# Patient Record
Sex: Male | Born: 1995 | Hispanic: Yes | Marital: Married | Smoking: Current every day smoker
Health system: Southern US, Community
[De-identification: ages and names within clinical notes are randomized; demographics above are authoritative.]

## PROBLEM LIST (undated history)

## (undated) DIAGNOSIS — E669 Obesity, unspecified: Secondary | ICD-10-CM

## (undated) DIAGNOSIS — S31119A Laceration without foreign body of abdominal wall, unspecified quadrant without penetration into peritoneal cavity, initial encounter: Secondary | ICD-10-CM

## (undated) HISTORY — DX: Obesity, unspecified: E66.9

## (undated) HISTORY — DX: Laceration without foreign body of abdominal wall, unspecified quadrant without penetration into peritoneal cavity, initial encounter: S31.119A

---
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
## 2014-04-16 ENCOUNTER — Encounter (HOSPITAL_COMMUNITY): Payer: Self-pay | Admitting: Emergency Medicine

## 2014-04-16 ENCOUNTER — Emergency Department (INDEPENDENT_AMBULATORY_CARE_PROVIDER_SITE_OTHER)
Admission: EM | Admit: 2014-04-16 | Discharge: 2014-04-16 | Disposition: A | Discharge status: Home / Self Care | Payer: Self-pay | Source: Home / Self Care | Attending: Family Medicine | Admitting: Family Medicine

## 2014-04-16 DIAGNOSIS — R3129 Other microscopic hematuria: Secondary | ICD-10-CM

## 2014-04-16 DIAGNOSIS — R312 Other microscopic hematuria: Secondary | ICD-10-CM

## 2014-04-16 LAB — POCT URINALYSIS DIP (DEVICE)
BILIRUBIN URINE: NEGATIVE
GLUCOSE, UA: NEGATIVE mg/dL
Ketones, ur: NEGATIVE mg/dL
LEUKOCYTES UA: NEGATIVE
NITRITE: NEGATIVE
Protein, ur: NEGATIVE mg/dL
Specific Gravity, Urine: >=1.03 (ref 1.005–1.030)
Urobilinogen, UA: 0.2 mg/dL (ref 0.0–1.0)
pH: 6 (ref 5.0–8.0)

## 2014-04-16 MED ORDER — DOXYCYCLINE HYCLATE 100 MG PO CAPS: 100.0000 mg | ORAL_CAPSULE | Freq: Two times a day (BID) | ORAL | Status: DC

## 2014-04-16 NOTE — ED Notes (Signed)
C/o urinary frequency/urgency onset Friday Sx also include abd pain Denies f/v/n/d, dysuria, penile d/c Alert, no signs of acute distress.

## 2014-04-16 NOTE — ED Provider Notes (Signed)
CSN: [id number]     Arrival date & time 04/16/14  1402 History   First MD Initiated Contact with Patient 04/16/14 1453     Chief Complaint  Patient presents with  . Urinary Frequency   (Consider location/radiation/quality/duration/timing/severity/associated sxs/prior Treatment) Patient is a 18 y.o. male presenting with frequency. The history is provided by the patient.  Urinary Frequency This is a new problem. The current episode started more than 2 days ago. The problem has not changed since onset.Associated symptoms include abdominal pain. Pertinent negatives include no headaches and no shortness of breath.    History reviewed. No pertinent past medical history. History reviewed. No pertinent past surgical history. No family history on file. History  Substance Use Topics  . Smoking status: Current Some Day Smoker  . Smokeless tobacco: Not on file  . Alcohol Use: No    Review of Systems  Constitutional: Negative.  Negative for fever.  Respiratory: Negative for shortness of breath.   Gastrointestinal: Positive for abdominal pain. Negative for nausea and vomiting.  Genitourinary: Positive for urgency and frequency. Negative for dysuria, hematuria, discharge, penile swelling, genital sores, penile pain and testicular pain.  Musculoskeletal: Negative.   Neurological: Negative for headaches.    Allergies  Review of patient's allergies indicates no known allergies.  Home Medications   Prior to Admission medications   Medication Sig Start Date End Date Taking? Authorizing Provider  cephALEXin (KEFLEX) 500 MG capsule Take 1 capsule (500 mg total) by mouth 2 (two) times daily. 06/30/12   Nicole Pisciotta, PA-C  doxycycline (VIBRAMYCIN) 100 MG capsule Take 1 capsule (100 mg total) by mouth 2 (two) times daily. 04/16/14   Linna HoffJames D Declan Adamson, MD  traMADol (ULTRAM) 50 MG tablet Take 1 tablet (50 mg total) by mouth every 6 (six) hours as needed for pain. 06/30/12   Nicole Pisciotta, PA-C   BP  147/86 mmHg  Pulse 69  Temp(Src) 98 F (36.7 C) (Oral)  Resp 16  SpO2 98% Physical Exam  Constitutional: He is oriented to person, place, and time. He appears well-developed and well-nourished. No distress.  Abdominal: Soft. Normal appearance and bowel sounds are normal. He exhibits no distension and no mass. There is tenderness in the suprapubic area. There is no rigidity, no rebound and no guarding.  Neurological: He is alert and oriented to person, place, and time.  Skin: Skin is warm and dry.  Nursing note and vitals reviewed.   ED Course  Procedures (including critical care time) Labs Review Labs Reviewed  POCT URINALYSIS DIP (DEVICE) - Abnormal; Notable for the following:    Hgb urine dipstick LARGE (*)    All other components within normal limits    Imaging Review No results found.   MDM   1. Microscopic hematuria       Linna HoffJames D Heith Haigler, MD 04/16/14 [phone]

## 2014-04-16 NOTE — Discharge Instructions (Signed)
Drink lots of water , see urologist next week for urine recheck. Take all of antibiotic.

## 2014-04-22 ENCOUNTER — Emergency Department (HOSPITAL_COMMUNITY): Payer: Self-pay

## 2014-04-22 ENCOUNTER — Emergency Department (HOSPITAL_COMMUNITY)
Admission: EM | Admit: 2014-04-22 | Discharge: 2014-04-22 | Disposition: A | Discharge status: Home / Self Care | Payer: Self-pay | Attending: Emergency Medicine | Admitting: Emergency Medicine

## 2014-04-22 ENCOUNTER — Encounter (HOSPITAL_COMMUNITY): Payer: Self-pay | Admitting: *Deleted

## 2014-04-22 DIAGNOSIS — Z720 Tobacco use: Secondary | ICD-10-CM | POA: Insufficient documentation

## 2014-04-22 DIAGNOSIS — R10814 Left lower quadrant abdominal tenderness: Secondary | ICD-10-CM

## 2014-04-22 DIAGNOSIS — N201 Calculus of ureter: Secondary | ICD-10-CM | POA: Insufficient documentation

## 2014-04-22 DIAGNOSIS — R1030 Lower abdominal pain, unspecified: Secondary | ICD-10-CM

## 2014-04-22 DIAGNOSIS — Z792 Long term (current) use of antibiotics: Secondary | ICD-10-CM | POA: Insufficient documentation

## 2014-04-22 LAB — CBC WITH DIFFERENTIAL/PLATELET
BASOS ABS: 0 10*3/uL (ref 0.0–0.1)
BASOS PCT: 0 % (ref 0–1)
EOS ABS: 0.4 10*3/uL (ref 0.0–0.7)
EOS PCT: 4 % (ref 0–5)
HCT: 46.1 % (ref 39.0–52.0)
HEMOGLOBIN: 16.4 g/dL (ref 13.0–17.0)
LYMPHS PCT: 45 % (ref 12–46)
Lymphs Abs: 4.2 10*3/uL — ABNORMAL HIGH (ref 0.7–4.0)
MCH: 30.1 pg (ref 26.0–34.0)
MCHC: 35.6 g/dL (ref 30.0–36.0)
MCV: 84.6 fL (ref 78.0–100.0)
MONO ABS: 0.7 10*3/uL (ref 0.1–1.0)
Monocytes Relative: 8 % (ref 3–12)
Neutro Abs: 4 10*3/uL (ref 1.7–7.7)
Neutrophils Relative %: 43 % (ref 43–77)
PLATELETS: 300 10*3/uL (ref 150–400)
RBC: 5.45 MIL/uL (ref 4.22–5.81)
RDW: 12.8 % (ref 11.5–15.5)
WBC: 9.3 10*3/uL (ref 4.0–10.5)

## 2014-04-22 LAB — URINALYSIS, ROUTINE W REFLEX MICROSCOPIC
Bilirubin Urine: NEGATIVE
Glucose, UA: NEGATIVE mg/dL
KETONES UR: NEGATIVE mg/dL
LEUKOCYTES UA: NEGATIVE
NITRITE: NEGATIVE
PH: 5 (ref 5.0–8.0)
PROTEIN: NEGATIVE mg/dL
Specific Gravity, Urine: 1.017 (ref 1.005–1.030)
Urobilinogen, UA: 0.2 mg/dL (ref 0.0–1.0)

## 2014-04-22 LAB — URINE MICROSCOPIC-ADD ON

## 2014-04-22 LAB — COMPREHENSIVE METABOLIC PANEL
ALT: 153 U/L — ABNORMAL HIGH (ref 0–53)
AST: 62 U/L — AB (ref 0–37)
Albumin: 4.5 g/dL (ref 3.5–5.2)
Alkaline Phosphatase: 99 U/L (ref 39–117)
Anion gap: 14 (ref 5–15)
BUN: 13 mg/dL (ref 6–23)
CALCIUM: 9.5 mg/dL (ref 8.4–10.5)
CO2: 24 meq/L (ref 19–32)
CREATININE: 0.76 mg/dL (ref 0.50–1.35)
Chloride: 101 mEq/L (ref 96–112)
GFR calc Af Amer: >90 mL/min (ref >90)
Glucose, Bld: 100 mg/dL — ABNORMAL HIGH (ref 70–99)
Potassium: 4 mEq/L (ref 3.7–5.3)
Sodium: 139 mEq/L (ref 137–147)
TOTAL PROTEIN: 8 g/dL (ref 6.0–8.3)
Total Bilirubin: 0.3 mg/dL (ref 0.3–1.2)

## 2014-04-22 LAB — LIPASE, BLOOD: LIPASE: 42 U/L (ref 11–59)

## 2014-04-22 MED ORDER — MORPHINE SULFATE 4 MG/ML IJ SOLN
4.0000 mg | Freq: Once | INTRAMUSCULAR | Status: AC
  Administered 2014-04-22: 4 mg via INTRAVENOUS
  Filled 2014-04-22: qty 1

## 2014-04-22 MED ORDER — SODIUM CHLORIDE 0.9 % IV BOLUS (SEPSIS)
1000.0000 mL | Freq: Once | INTRAVENOUS | Status: AC
Start: 2014-04-22 — End: 2014-04-22
  Administered 2014-04-22: 1000 mL via INTRAVENOUS

## 2014-04-22 MED ORDER — IOHEXOL 300 MG/ML  SOLN
25.0000 mL | Freq: Once | INTRAMUSCULAR | Status: AC | PRN
  Administered 2014-04-22: 25 mL via ORAL

## 2014-04-22 MED ORDER — HYDROCODONE-ACETAMINOPHEN 5-325 MG PO TABS: 1.0000 | ORAL_TABLET | ORAL | Status: DC | PRN

## 2014-04-22 MED ORDER — IOHEXOL 300 MG/ML  SOLN
80.0000 mL | Freq: Once | INTRAMUSCULAR | Status: AC | PRN
  Administered 2014-04-22: 80 mL via INTRAVENOUS

## 2014-04-22 MED ORDER — TAMSULOSIN HCL 0.4 MG PO CAPS
0.4000 mg | ORAL_CAPSULE | Freq: Every day | ORAL | Status: DC
Start: 2014-04-22 — End: 2017-04-07

## 2014-04-22 NOTE — ED Provider Notes (Signed)
CSN: [id number]     Arrival date & time 04/22/14  0725 History   First MD Initiated Contact with Patient 04/22/14 0759     Chief Complaint  Patient presents with  . Abdominal Pain     (Consider location/radiation/quality/duration/timing/severity/associated sxs/prior Treatment) HPI  Kevin Fox is a 18 y.o. male without significant PMH presenting with 2-3 week history of lower abdominal pain worse in LLQ that described as a burning and intermittent. No nausea, vomiting, diarrhea. No hematochezia or melena. Pt presented to urgent care 5 days ago, diagnosed with hematuria and given doxycycline. Pt endorses improvement of pain for 2 days but then it returned. Patient does note that he has urgency. He also is frequency. No dysuria or smell to his urine. Patient denies any penile or scrotal complaints. No discharge or lesions. No frank hematuria. She denies fevers, chills, recent illnesses. No chest pain, shortness of breath, headaches. Patient does endorse mild back pain but denies currently.   History reviewed. No pertinent past medical history. History reviewed. No pertinent past surgical history. No family history on file. History  Substance Use Topics  . Smoking status: Current Some Day Smoker  . Smokeless tobacco: Not on file  . Alcohol Use: No    Review of Systems  Constitutional: Negative for fever and chills.  HENT: Negative for congestion and rhinorrhea.   Respiratory: Negative for shortness of breath.   Cardiovascular: Negative for chest pain.  Gastrointestinal: Positive for abdominal pain. Negative for nausea, vomiting and diarrhea.  Genitourinary: Positive for urgency and hematuria. Negative for dysuria, discharge, penile swelling, scrotal swelling and penile pain.  Musculoskeletal: Negative for back pain and gait problem.  Skin: Negative for rash.  Neurological: Negative for headaches.      Allergies  Review of patient's allergies indicates no known  allergies.  Home Medications   Prior to Admission medications   Medication Sig Start Date End Date Taking? Authorizing Provider  doxycycline (VIBRAMYCIN) 100 MG capsule Take 1 capsule (100 mg total) by mouth 2 (two) times daily. 04/16/14  Yes Linna HoffJames D Kindl, MD  cephALEXin (KEFLEX) 500 MG capsule Take 1 capsule (500 mg total) by mouth 2 (two) times daily. Patient not taking: Reported on 04/22/2014 06/30/12   Joni ReiningNicole Pisciotta, PA-C  HYDROcodone-acetaminophen (NORCO/VICODIN) 5-325 MG per tablet Take 1-2 tablets by mouth every 4 (four) hours as needed for moderate pain or severe pain. 04/22/14   Louann SjogrenVictoria L Roshawnda Pecora, PA-C  tamsulosin (FLOMAX) 0.4 MG CAPS capsule Take 1 capsule (0.4 mg total) by mouth daily after supper. 04/22/14   Louann SjogrenVictoria L Terence Bart, PA-C  traMADol (ULTRAM) 50 MG tablet Take 1 tablet (50 mg total) by mouth every 6 (six) hours as needed for pain. Patient not taking: Reported on 04/22/2014 06/30/12   Joni ReiningNicole Pisciotta, PA-C   BP 126/66 mmHg  Pulse 66  Temp(Src) 97.6 F (36.4 C) (Oral)  Resp 18  Ht 5\' 6"  (1.676 m)  SpO2 100% Physical Exam  Constitutional: He appears well-developed and well-nourished. No distress.  HENT:  Head: Normocephalic and atraumatic.  Eyes: Conjunctivae and EOM are normal. Right eye exhibits no discharge. Left eye exhibits no discharge.  Cardiovascular: Normal rate, regular rhythm and normal heart sounds.   Pulmonary/Chest: Effort normal and breath sounds normal. No respiratory distress. He has no wheezes.  Abdominal: Soft. Bowel sounds are normal. He exhibits no distension.  Diffuse abdominal tenderness worse and left lower quadrant and suprapubic region without rebound, rigidity, or guarding. No back tenderness or CVA tenderness.  Genitourinary:  Normal appearing uncircumcised penis without erythema, lesions, swelling. No penile discharge. Scrotum without edema, erythema or lesions. Normal testicular lie. No tenderness to palpation. No inguinal hernias.    Neurological: He is alert. He exhibits normal muscle tone. Coordination normal.  Skin: Skin is warm and dry. He is not diaphoretic.  Nursing note and vitals reviewed.   ED Course  Procedures (including critical care time) Labs Review Labs Reviewed  CBC WITH DIFFERENTIAL - Abnormal; Notable for the following:    Lymphs Abs 4.2 (*)    All other components within normal limits  COMPREHENSIVE METABOLIC PANEL - Abnormal; Notable for the following:    Glucose, Bld 100 (*)    AST 62 (*)    ALT 153 (*)    All other components within normal limits  URINALYSIS, ROUTINE W REFLEX MICROSCOPIC - Abnormal; Notable for the following:    APPearance HAZY (*)    Hgb urine dipstick LARGE (*)    All other components within normal limits  URINE MICROSCOPIC-ADD ON - Abnormal; Notable for the following:    Squamous Epithelial / LPF FEW (*)    Bacteria, UA FEW (*)    Crystals CA OXALATE CRYSTALS (*)    All other components within normal limits  LIPASE, BLOOD    Imaging Review Ct Abdomen Pelvis W Contrast  04/22/2014   CLINICAL DATA:  Two week history of abdominal pain  EXAM: CT ABDOMEN AND PELVIS WITH CONTRAST  TECHNIQUE: Multidetector CT imaging of the abdomen and pelvis was performed using the standard protocol following bolus administration of intravenous contrast. Oral contrast was also administered.  CONTRAST:  80mL OMNIPAQUE IOHEXOL 300 MG/ML  SOLN  COMPARISON:  None.  FINDINGS: There is mild bibasilar lung atelectatic change.  No focal liver lesions are identified. Gallbladder wall is not thickened. There is no appreciable biliary duct dilatation.  Spleen, pancreas, and adrenals appear normal. Kidneys bilaterally show no mass or hydronephrosis on either side. There is no renal calculus on either side. There is a 1 mm calculus in the right ureterovesical junction seen on axial slice 91 series 2. No other ureteral calculi are identified.  In the pelvis, the urinary bladder is midline with normal wall  thickness. There is no pelvic mass or fluid collection.  The appendix appears normal. The terminal ileum appears normal. There are several lymph nodes in the right lower quadrant, largest measuring 1.2 x 0.9 cm.  There is no bowel obstruction. No free air or portal venous air. There is no appreciable ascites, abscess, or adenopathy by size criteria. Aorta shows no evidence of aneurysm. There are no blastic or lytic bone lesions.  IMPRESSION: 1 mm calculus right ureterovesical junction without appreciable hydronephrosis or ureterectasis on the right.  Appendix appears normal. There are several nearby lymph nodes focally in the right lower quadrant. This finding potentially may indicate a degree of mesenteric adenitis.  No bowel obstruction.  No abscess.   Electronically Signed   By: Bretta Bang M.D.   On: 04/22/2014 10:38     EKG Interpretation None      MDM   Final diagnoses:  LLQ abdominal tenderness  Ureterolithiasis  Suprapubic abdominal pain, unspecified laterality   Pt has been diagnosed with a Kidney Stone via CT. There is no evidence of significant hydronephrosis, serum creatine WNL, vitals sign stable and the pt does not have irratractable vomiting. UA without evidence of infection. On repeat abdominal exam pt with improvement of abdominal tenderness. Pt has nonsurgical abdomen.  Pt likely due to ureterolithiasis. Pt will be dc home with pain medications and flomax. Driving and sedation precautions provided. Patient without a PCP. Patient to establish care and follow up with the wellness center.   Discussed return precautions with patient. Discussed all results and patient verbalizes understanding and agrees with plan.  Case has been discussed with Dr. Gwendolyn GrantWalden who agrees with the above plan and to discharge.       Louann SjogrenVictoria L Trei Schoch, PA-C 04/22/14 1252  Elwin MochaBlair Walden, MD 04/22/14 1539

## 2014-04-22 NOTE — ED Notes (Signed)
Pt states that he has had lower abdominal pain that has been ongoing for 2-3 weeks. Pt states that he was seen at urgent care for the same and given antibiotics. Pt states that area is tender to touch. Denies N/V/D

## 2014-04-22 NOTE — Discharge Instructions (Signed)
Return to the emergency room with worsening of symptoms, new symptoms or with symptoms that are concerning, especially severe pain,  unable to tolerate fluids, or abdominal pain, fevers, unable to urinate. Make appointment with wellness center to establish care. norco for severe pain. Do not operate machinery, drive or drink alcohol while taking narcotics or muscle relaxers. Stop taking doxycycline. Start taking ibuprofen. Ibuprofen 400mg  (2 tablets 200mg ) every 5-6 hours for 3-5 days and then as needed for pain.   Kidney Stones Kidney stones (urolithiasis) are deposits that form inside your kidneys. The intense pain is caused by the stone moving through the urinary tract. When the stone moves, the ureter goes into spasm around the stone. The stone is usually passed in the urine.  CAUSES   A disorder that makes certain neck glands produce too much parathyroid hormone (primary hyperparathyroidism).  A buildup of uric acid crystals, similar to gout in your joints.  Narrowing (stricture) of the ureter.  A kidney obstruction present at birth (congenital obstruction).  Previous surgery on the kidney or ureters.  Numerous kidney infections. SYMPTOMS   Feeling sick to your stomach (nauseous).  Throwing up (vomiting).  Blood in the urine (hematuria).  Pain that usually spreads (radiates) to the groin.  Frequency or urgency of urination. DIAGNOSIS   Taking a history and physical exam.  Blood or urine tests.  CT scan.  Occasionally, an examination of the inside of the urinary bladder (cystoscopy) is performed. TREATMENT   Observation.  Increasing your fluid intake.  Extracorporeal shock wave lithotripsy--This is a noninvasive procedure that uses shock waves to break up kidney stones.  Surgery may be needed if you have severe pain or persistent obstruction. There are various surgical procedures. Most of the procedures are performed with the use of small instruments. Only small  incisions are needed to accommodate these instruments, so recovery time is minimized. The size, location, and chemical composition are all important variables that will determine the proper choice of action for you. Talk to your health care provider to better understand your situation so that you will minimize the risk of injury to yourself and your kidney.  HOME CARE INSTRUCTIONS   Drink enough water and fluids to keep your urine clear or pale yellow. This will help you to pass the stone or stone fragments.  Strain all urine through the provided strainer. Keep all particulate matter and stones for your health care provider to see. The stone causing the pain may be as small as a grain of salt. It is very important to use the strainer each and every time you pass your urine. The collection of your stone will allow your health care provider to analyze it and verify that a stone has actually passed. The stone analysis will often identify what you can do to reduce the incidence of recurrences.  Only take over-the-counter or prescription medicines for pain, discomfort, or fever as directed by your health care provider.  Make a follow-up appointment with your health care provider as directed.  Get follow-up X-rays if required. The absence of pain does not always mean that the stone has passed. It may have only stopped moving. If the urine remains completely obstructed, it can cause loss of kidney function or even complete destruction of the kidney. It is your responsibility to make sure X-rays and follow-ups are completed. Ultrasounds of the kidney can show blockages and the status of the kidney. Ultrasounds are not associated with any radiation and can be performed easily  in a matter of minutes. SEEK MEDICAL CARE IF:  You experience pain that is progressive and unresponsive to any pain medicine you have been prescribed. SEEK IMMEDIATE MEDICAL CARE IF:   Pain cannot be controlled with the prescribed  medicine.  You have a fever or shaking chills.  The severity or intensity of pain increases over 18 hours and is not relieved by pain medicine.  You develop a new onset of abdominal pain.  You feel faint or pass out.  You are unable to urinate. MAKE SURE YOU:   Understand these instructions.  Will watch your condition.  Will get help right away if you are not doing well or get worse. Document Released: 05/09/2005 Document Revised: 01/09/2013 Document Reviewed: 10/10/2012 [facility] Patient Information 2015 [employer] [place]. This information is not intended to replace advice given to you by your health care provider. Make sure you discuss any questions you have with your health care provider.

## 2014-05-19 ENCOUNTER — Ambulatory Visit: Payer: Self-pay

## 2015-06-10 ENCOUNTER — Emergency Department (INDEPENDENT_AMBULATORY_CARE_PROVIDER_SITE_OTHER)
Admission: EM | Admit: 2015-06-10 | Discharge: 2015-06-10 | Disposition: A | Discharge status: Home / Self Care | Payer: Self-pay | Source: Home / Self Care | Attending: Family Medicine | Admitting: Family Medicine

## 2015-06-10 ENCOUNTER — Encounter (HOSPITAL_COMMUNITY): Payer: Self-pay | Admitting: Emergency Medicine

## 2015-06-10 ENCOUNTER — Emergency Department (INDEPENDENT_AMBULATORY_CARE_PROVIDER_SITE_OTHER): Payer: Self-pay

## 2015-06-10 DIAGNOSIS — S93402A Sprain of unspecified ligament of left ankle, initial encounter: Secondary | ICD-10-CM

## 2015-06-10 MED ORDER — HYDROCODONE-ACETAMINOPHEN 5-325 MG PO TABS: 1.0000 | ORAL_TABLET | ORAL | Status: DC | PRN

## 2015-06-10 NOTE — ED Notes (Signed)
Reports walking outside today.  Walking down steps and rolled left ankle.  Feels pain and heard a pop at the time of the incident.

## 2015-06-10 NOTE — ED Provider Notes (Signed)
CSN: [id number]     Arrival date & time 06/10/15  1925 History   First MD Initiated Contact with Patient 06/10/15 2008     Chief Complaint  Patient presents with  . Ankle Pain   (Consider location/radiation/quality/duration/timing/severity/associated sxs/prior Treatment) Patient is a 20 y.o. male presenting with ankle pain. The history is provided by the patient. No language interpreter was used.  Ankle Pain Location:  Ankle and foot Injury: yes   Ankle location:  L ankle Foot location:  L foot Pain details:    Quality:  Aching   Radiates to:  Does not radiate   Severity:  No pain   Onset quality:  Gradual   Timing:  Constant Chronicity:  New Prior injury to area:  No Relieved by:  Nothing Worsened by:  Nothing tried Ineffective treatments:  None tried Risk factors: no known bone disorder     History reviewed. No pertinent past medical history. History reviewed. No pertinent past surgical history. No family history on file. Social History  Substance Use Topics  . Smoking status: Never Smoker   . Smokeless tobacco: None  . Alcohol Use: Yes    Review of Systems  All other systems reviewed and are negative.   Allergies  Review of patient's allergies indicates no known allergies.  Home Medications   Prior to Admission medications   Medication Sig Start Date End Date Taking? Authorizing Provider  cephALEXin (KEFLEX) 500 MG capsule Take 1 capsule (500 mg total) by mouth 2 (two) times daily. Patient not taking: Reported on 04/22/2014 06/30/12   Joni Reining Pisciotta, PA-C  doxycycline (VIBRAMYCIN) 100 MG capsule Take 1 capsule (100 mg total) by mouth 2 (two) times daily. Patient not taking: Reported on 06/10/2015 04/16/14   Linna Hoff, MD  HYDROcodone-acetaminophen (NORCO/VICODIN) 5-325 MG per tablet Take 1-2 tablets by mouth every 4 (four) hours as needed for moderate pain or severe pain. Patient not taking: Reported on 06/10/2015 04/22/14   Oswaldo Conroy, PA-C  tamsulosin  (FLOMAX) 0.4 MG CAPS capsule Take 1 capsule (0.4 mg total) by mouth daily after supper. Patient not taking: Reported on 06/10/2015 04/22/14   Oswaldo Conroy, PA-C  traMADol (ULTRAM) 50 MG tablet Take 1 tablet (50 mg total) by mouth every 6 (six) hours as needed for pain. Patient not taking: Reported on 04/22/2014 06/30/12   Wynetta Emery, PA-C   Meds Ordered and Administered this Visit  Medications - No data to display  BP 145/91 mmHg  Pulse 97  Temp(Src) 98.1 F (36.7 C)  SpO2 99% No data found.   Physical Exam  Constitutional: He is oriented to person, place, and time. He appears well-developed and well-nourished.  Musculoskeletal: He exhibits tenderness.  Swollen tender left ankle,  Pain with range of motion,  nv and ns intact  Neurological: He is alert and oriented to person, place, and time. He has normal reflexes.  Skin: Skin is warm.  Psychiatric: He has a normal mood and affect.  Nursing note and vitals reviewed.   ED Course  Procedures (including critical care time)  Labs Review Labs Reviewed - No data to display  Imaging Review No results found.   Visual Acuity Review  Right Eye Distance:   Left Eye Distance:   Bilateral Distance:    Right Eye Near:   Left Eye Near:    Bilateral Near:         MDM   1. Ankle sprain, left, initial encounter    aso Crutches Schedule to see Dr.  swintech for recheck in 1 week Meds ordered this encounter  Medications  . HYDROcodone-acetaminophen (NORCO/VICODIN) 5-325 MG tablet    Sig: Take 1-2 tablets by mouth every 4 (four) hours as needed for moderate pain or severe pain.    Dispense:  20 tablet    Refill:  0    Order Specific Question:  Supervising Provider    Answer:  Bradd Canary D [[id number]]     Lonia Skinner [place], PA-C 06/10/15 2046

## 2015-06-10 NOTE — Discharge Instructions (Signed)
Ankle Sprain  An ankle sprain is an injury to the strong, fibrous tissues (ligaments) that hold the bones of your ankle joint together.   CAUSES  An ankle sprain is usually caused by a fall or by twisting your ankle. Ankle sprains most commonly occur when you step on the outer edge of your foot, and your ankle turns inward. People who participate in sports are more prone to these types of injuries.   SYMPTOMS    Pain in your ankle. The pain may be present at rest or only when you are trying to stand or walk.   Swelling.   Bruising. Bruising may develop immediately or within 1 to 2 days after your injury.   Difficulty standing or walking, particularly when turning corners or changing directions.  DIAGNOSIS   Your caregiver will ask you details about your injury and perform a physical exam of your ankle to determine if you have an ankle sprain. During the physical exam, your caregiver will press on and apply pressure to specific areas of your foot and ankle. Your caregiver will try to move your ankle in certain ways. An X-ray exam may be done to be sure a bone was not broken or a ligament did not separate from one of the bones in your ankle (avulsion fracture).   TREATMENT   Certain types of braces can help stabilize your ankle. Your caregiver can make a recommendation for this. Your caregiver may recommend the use of medicine for pain. If your sprain is severe, your caregiver may refer you to a surgeon who helps to restore function to parts of your skeletal system (orthopedist) or a physical therapist.  HOME CARE INSTRUCTIONS    Apply ice to your injury for 1-2 days or as directed by your caregiver. Applying ice helps to reduce inflammation and pain.    Put ice in a plastic bag.    Place a towel between your skin and the bag.    Leave the ice on for 15-20 minutes at a time, every 2 hours while you are awake.   Only take over-the-counter or prescription medicines for pain, discomfort, or fever as directed by  your caregiver.   Elevate your injured ankle above the level of your heart as much as possible for 2-3 days.   If your caregiver recommends crutches, use them as instructed. Gradually put weight on the affected ankle. Continue to use crutches or a cane until you can walk without feeling pain in your ankle.   If you have a plaster splint, wear the splint as directed by your caregiver. Do not rest it on anything harder than a pillow for the first 24 hours. Do not put weight on it. Do not get it wet. You may take it off to take a shower or bath.   You may have been given an elastic bandage to wear around your ankle to provide support. If the elastic bandage is too tight (you have numbness or tingling in your foot or your foot becomes cold and blue), adjust the bandage to make it comfortable.   If you have an air splint, you may blow more air into it or let air out to make it more comfortable. You may take your splint off at night and before taking a shower or bath. Wiggle your toes in the splint several times per day to decrease swelling.  SEEK MEDICAL CARE IF:    You have rapidly increasing bruising or swelling.   Your toes feel   extremely cold or you lose feeling in your foot.   Your pain is not relieved with medicine.  SEEK IMMEDIATE MEDICAL CARE IF:   Your toes are numb or blue.   You have severe pain that is increasing.  MAKE SURE YOU:    Understand these instructions.   Will watch your condition.   Will get help right away if you are not doing well or get worse.     This information is not intended to replace advice given to you by your health care provider. Make sure you discuss any questions you have with your health care provider.     Document Released: 05/09/2005 Document Revised: 05/30/2014 Document Reviewed: 05/21/2011  Elsevier Interactive Patient Education 2016 [employer]

## 2017-01-31 ENCOUNTER — Emergency Department (HOSPITAL_COMMUNITY): Payer: Medicaid Other

## 2017-01-31 ENCOUNTER — Encounter (HOSPITAL_COMMUNITY): Admission: EM | Disposition: A | Discharge status: Home / Self Care | Payer: Self-pay | Source: Home / Self Care

## 2017-01-31 ENCOUNTER — Inpatient Hospital Stay (HOSPITAL_COMMUNITY)
Admission: EM | Admit: 2017-01-31 | Discharge: 2017-02-02 | DRG: 329 | Disposition: A | Discharge status: Home / Self Care | Payer: Medicaid Other | Attending: Surgery | Admitting: Surgery

## 2017-01-31 ENCOUNTER — Emergency Department (HOSPITAL_COMMUNITY): Payer: Medicaid Other | Admitting: Anesthesiology

## 2017-01-31 ENCOUNTER — Encounter (HOSPITAL_COMMUNITY): Payer: Self-pay | Admitting: Emergency Medicine

## 2017-01-31 DIAGNOSIS — S31109A Unspecified open wound of abdominal wall, unspecified quadrant without penetration into peritoneal cavity, initial encounter: Secondary | ICD-10-CM | POA: Diagnosis present

## 2017-01-31 DIAGNOSIS — S31119A Laceration without foreign body of abdominal wall, unspecified quadrant without penetration into peritoneal cavity, initial encounter: Secondary | ICD-10-CM

## 2017-01-31 DIAGNOSIS — S31615A Laceration without foreign body of abdominal wall, periumbilic region with penetration into peritoneal cavity, initial encounter: Secondary | ICD-10-CM | POA: Diagnosis present

## 2017-01-31 DIAGNOSIS — R402142 Coma scale, eyes open, spontaneous, at arrival to emergency department: Secondary | ICD-10-CM | POA: Diagnosis present

## 2017-01-31 DIAGNOSIS — R402252 Coma scale, best verbal response, oriented, at arrival to emergency department: Secondary | ICD-10-CM | POA: Diagnosis present

## 2017-01-31 DIAGNOSIS — F17210 Nicotine dependence, cigarettes, uncomplicated: Secondary | ICD-10-CM | POA: Diagnosis present

## 2017-01-31 DIAGNOSIS — S36438A Laceration of other part of small intestine, initial encounter: Principal | ICD-10-CM | POA: Diagnosis present

## 2017-01-31 DIAGNOSIS — R402362 Coma scale, best motor response, obeys commands, at arrival to emergency department: Secondary | ICD-10-CM | POA: Diagnosis present

## 2017-01-31 HISTORY — PX: LAPAROTOMY: SHX154

## 2017-01-31 LAB — I-STAT CHEM 8, ED
BUN: 21 mg/dL — AB (ref 6–20)
CALCIUM ION: 1.16 mmol/L (ref 1.15–1.40)
CHLORIDE: 105 mmol/L (ref 101–111)
CREATININE: 0.8 mg/dL (ref 0.61–1.24)
GLUCOSE: 143 mg/dL — AB (ref 65–99)
HCT: 47 % (ref 39.0–52.0)
Hemoglobin: 16 g/dL (ref 13.0–17.0)
Potassium: 3.5 mmol/L (ref 3.5–5.1)
Sodium: 143 mmol/L (ref 135–145)
TCO2: 23 mmol/L (ref 22–32)

## 2017-01-31 LAB — CBC
HCT: 46.4 % (ref 39.0–52.0)
HEMOGLOBIN: 16.4 g/dL (ref 13.0–17.0)
MCH: 30.7 pg (ref 26.0–34.0)
MCHC: 35.3 g/dL (ref 30.0–36.0)
MCV: 86.7 fL (ref 78.0–100.0)
Platelets: 276 10*3/uL (ref 150–400)
RBC: 5.35 MIL/uL (ref 4.22–5.81)
RDW: 12.7 % (ref 11.5–15.5)
WBC: 13.3 10*3/uL — ABNORMAL HIGH (ref 4.0–10.5)

## 2017-01-31 LAB — COMPREHENSIVE METABOLIC PANEL
ALT: 180 U/L — ABNORMAL HIGH (ref 17–63)
ANION GAP: 10 (ref 5–15)
AST: 82 U/L — ABNORMAL HIGH (ref 15–41)
Albumin: 4.4 g/dL (ref 3.5–5.0)
Alkaline Phosphatase: 88 U/L (ref 38–126)
BILIRUBIN TOTAL: 0.4 mg/dL (ref 0.3–1.2)
BUN: 18 mg/dL (ref 6–20)
CALCIUM: 9.7 mg/dL (ref 8.9–10.3)
CO2: 20 mmol/L — ABNORMAL LOW (ref 22–32)
Chloride: 107 mmol/L (ref 101–111)
Creatinine, Ser: 0.96 mg/dL (ref 0.61–1.24)
GFR calc non Af Amer: >60 mL/min (ref >60)
GLUCOSE: 139 mg/dL — AB (ref 65–99)
Potassium: 3.5 mmol/L (ref 3.5–5.1)
Sodium: 137 mmol/L (ref 135–145)
TOTAL PROTEIN: 7.5 g/dL (ref 6.5–8.1)

## 2017-01-31 LAB — ABO/RH: ABO/RH(D): O POS

## 2017-01-31 LAB — PROTIME-INR
INR: 0.99
Prothrombin Time: 13 seconds (ref 11.4–15.2)

## 2017-01-31 LAB — CDS SEROLOGY

## 2017-01-31 LAB — I-STAT CG4 LACTIC ACID, ED: LACTIC ACID, VENOUS: 2.65 mmol/L — AB (ref 0.5–1.9)

## 2017-01-31 LAB — ETHANOL

## 2017-01-31 SURGERY — LAPAROTOMY, EXPLORATORY
Anesthesia: General | Site: Abdomen

## 2017-01-31 MED ORDER — CEFAZOLIN SODIUM-DEXTROSE 2-3 GM-% IV SOLR
INTRAVENOUS | Status: DC | PRN
  Administered 2017-01-31: 2 g via INTRAVENOUS

## 2017-01-31 MED ORDER — MIDAZOLAM HCL 2 MG/2ML IJ SOLN
INTRAMUSCULAR | Status: AC
  Filled 2017-01-31: qty 2

## 2017-01-31 MED ORDER — SUCCINYLCHOLINE CHLORIDE 20 MG/ML IJ SOLN
INTRAMUSCULAR | Status: DC | PRN
  Administered 2017-01-31: 160 mg via INTRAVENOUS

## 2017-01-31 MED ORDER — HYDROMORPHONE HCL 1 MG/ML IJ SOLN
INTRAMUSCULAR | Status: AC
  Filled 2017-01-31: qty 1

## 2017-01-31 MED ORDER — MIDAZOLAM HCL 5 MG/5ML IJ SOLN
INTRAMUSCULAR | Status: DC | PRN
  Administered 2017-01-31: 2 mg via INTRAVENOUS

## 2017-01-31 MED ORDER — LIDOCAINE HCL (CARDIAC) 20 MG/ML IV SOLN
INTRAVENOUS | Status: DC | PRN
  Administered 2017-01-31: 60 mg via INTRAVENOUS

## 2017-01-31 MED ORDER — LACTATED RINGERS IV SOLN
INTRAVENOUS | Status: DC | PRN
  Administered 2017-01-31: via INTRAVENOUS

## 2017-01-31 MED ORDER — FENTANYL CITRATE (PF) 100 MCG/2ML IJ SOLN
INTRAMUSCULAR | Status: DC | PRN
  Administered 2017-01-31: 100 ug via INTRAVENOUS
  Administered 2017-02-01: 150 ug via INTRAVENOUS

## 2017-01-31 MED ORDER — FENTANYL CITRATE (PF) 250 MCG/5ML IJ SOLN
INTRAMUSCULAR | Status: AC
  Filled 2017-01-31: qty 5

## 2017-01-31 MED ORDER — ROCURONIUM BROMIDE 100 MG/10ML IV SOLN
INTRAVENOUS | Status: DC | PRN
  Administered 2017-01-31: 50 mg via INTRAVENOUS

## 2017-01-31 MED ORDER — IOPAMIDOL (ISOVUE-300) INJECTION 61%
INTRAVENOUS | Status: AC
Start: 2017-01-31 — End: 2017-01-31
  Administered 2017-01-31: 100 mL
  Filled 2017-01-31: qty 100

## 2017-01-31 MED ORDER — PROPOFOL 10 MG/ML IV BOLUS
INTRAVENOUS | Status: AC
  Filled 2017-01-31: qty 20

## 2017-01-31 MED ORDER — HYDROMORPHONE HCL 1 MG/ML IJ SOLN
INTRAMUSCULAR | Status: AC | PRN
  Administered 2017-01-31: 1 mg via INTRAVENOUS

## 2017-01-31 MED ORDER — SODIUM CHLORIDE 0.9 % IV SOLN
INTRAVENOUS | Status: AC | PRN
  Administered 2017-01-31: 1000 mL via INTRAVENOUS

## 2017-01-31 MED ORDER — FENTANYL CITRATE (PF) 100 MCG/2ML IJ SOLN
INTRAMUSCULAR | Status: AC | PRN
  Administered 2017-01-31: 100 ug via INTRAVENOUS

## 2017-01-31 MED ORDER — FENTANYL CITRATE (PF) 100 MCG/2ML IJ SOLN
INTRAMUSCULAR | Status: AC
  Filled 2017-01-31: qty 2

## 2017-01-31 MED ORDER — PROPOFOL 10 MG/ML IV BOLUS
INTRAVENOUS | Status: DC | PRN
  Administered 2017-01-31: 160 mg via INTRAVENOUS

## 2017-01-31 SURGICAL SUPPLY — 66 items
APL SKNCLS STERI-STRIP NONHPOA (GAUZE/BANDAGES/DRESSINGS) ×2
APPLIER CLIP ROT 10 11.4 M/L (STAPLE)
APR CLP MED LRG 11.4X10 (STAPLE)
BAG SPEC RTRVL LRG 6X4 10 (ENDOMECHANICALS)
BENZOIN TINCTURE PRP APPL 2/3 (GAUZE/BANDAGES/DRESSINGS) ×4 IMPLANT
BLADE CLIPPER SURG (BLADE) ×3 IMPLANT
CANISTER SUCT 3000ML PPV (MISCELLANEOUS) ×4 IMPLANT
CHLORAPREP W/TINT 26ML (MISCELLANEOUS) ×4 IMPLANT
CLIP APPLIE ROT 10 11.4 M/L (STAPLE) IMPLANT
CLOSURE WOUND 1/2 X4 (GAUZE/BANDAGES/DRESSINGS) ×1
COVER SURGICAL LIGHT HANDLE (MISCELLANEOUS) ×4 IMPLANT
CUTTER FLEX LINEAR 45M (STAPLE) ×4 IMPLANT
DRAPE LAPAROSCOPIC ABDOMINAL (DRAPES) ×4 IMPLANT
DRAPE WARM FLUID 44X44 (DRAPE) ×4 IMPLANT
DRSG OPSITE POSTOP 4X10 (GAUZE/BANDAGES/DRESSINGS) ×3 IMPLANT
DRSG OPSITE POSTOP 4X8 (GAUZE/BANDAGES/DRESSINGS) IMPLANT
DRSG TEGADERM 2-3/8X2-3/4 SM (GAUZE/BANDAGES/DRESSINGS) ×2 IMPLANT
DRSG TEGADERM 4X4.75 (GAUZE/BANDAGES/DRESSINGS) ×4 IMPLANT
ELECT BLADE 6.5 EXT (BLADE) ×3 IMPLANT
ELECT CAUTERY BLADE 6.4 (BLADE) ×5 IMPLANT
ELECT REM PT RETURN 9FT ADLT (ELECTROSURGICAL) ×4
ELECTRODE REM PT RTRN 9FT ADLT (ELECTROSURGICAL) ×2 IMPLANT
ENDOLOOP SUT PDS II  0 18 (SUTURE)
ENDOLOOP SUT PDS II 0 18 (SUTURE) IMPLANT
FILTER SMOKE EVAC LAPAROSHD (FILTER) ×1 IMPLANT
GAUZE SPONGE 2X2 8PLY STRL LF (GAUZE/BANDAGES/DRESSINGS) ×2 IMPLANT
GLOVE BIO SURGEON STRL SZ7 (GLOVE) ×4 IMPLANT
GLOVE BIOGEL PI IND STRL 7.5 (GLOVE) ×2 IMPLANT
GLOVE BIOGEL PI INDICATOR 7.5 (GLOVE) ×2
GOWN STRL REUS W/ TWL LRG LVL3 (GOWN DISPOSABLE) ×3 IMPLANT
GOWN STRL REUS W/TWL LRG LVL3 (GOWN DISPOSABLE)
KIT BASIN OR (CUSTOM PROCEDURE TRAY) ×4 IMPLANT
KIT ROOM TURNOVER OR (KITS) ×4 IMPLANT
LIGASURE IMPACT 36 18CM CVD LR (INSTRUMENTS) IMPLANT
NS IRRIG 1000ML POUR BTL (IV SOLUTION) ×8 IMPLANT
PACK GENERAL/GYN (CUSTOM PROCEDURE TRAY) ×4 IMPLANT
PAD ARMBOARD 7.5X6 YLW CONV (MISCELLANEOUS) ×8 IMPLANT
POUCH SPECIMEN RETRIEVAL 10MM (ENDOMECHANICALS) ×1 IMPLANT
RELOAD STAPLE 45 3.5 BLU ETS (ENDOMECHANICALS) ×1 IMPLANT
RELOAD STAPLE TA45 3.5 REG BLU (ENDOMECHANICALS) ×4 IMPLANT
SCISSORS ENDO CVD 5DCS (MISCELLANEOUS) IMPLANT
SET IRRIG TUBING LAPAROSCOPIC (IRRIGATION / IRRIGATOR) ×1 IMPLANT
SHEARS HARMONIC ACE PLUS 36CM (ENDOMECHANICALS) ×1 IMPLANT
SLEEVE ENDOPATH XCEL 5M (ENDOMECHANICALS) ×1 IMPLANT
SPECIMEN JAR LARGE (MISCELLANEOUS) IMPLANT
SPECIMEN JAR SMALL (MISCELLANEOUS) ×4 IMPLANT
SPONGE GAUZE 2X2 STER 10/PKG (GAUZE/BANDAGES/DRESSINGS) ×2
SPONGE LAP 18X18 X RAY DECT (DISPOSABLE) ×6 IMPLANT
STAPLER VISISTAT 35W (STAPLE) ×4 IMPLANT
STRIP CLOSURE SKIN 1/2X4 (GAUZE/BANDAGES/DRESSINGS) ×3 IMPLANT
SUCTION POOLE TIP (SUCTIONS) ×4 IMPLANT
SUT MNCRL AB 4-0 PS2 18 (SUTURE) ×4 IMPLANT
SUT PDS AB 1 TP1 96 (SUTURE) ×8 IMPLANT
SUT SILK 2 0 SH CR/8 (SUTURE) ×4 IMPLANT
SUT SILK 2 0 TIES 10X30 (SUTURE) ×4 IMPLANT
SUT SILK 3 0 SH CR/8 (SUTURE) ×4 IMPLANT
SUT SILK 3 0 TIES 10X30 (SUTURE) ×4 IMPLANT
SUT VIC AB 3-0 SH 18 (SUTURE) IMPLANT
TOWEL OR 17X24 6PK STRL BLUE (TOWEL DISPOSABLE) ×4 IMPLANT
TOWEL OR 17X26 10 PK STRL BLUE (TOWEL DISPOSABLE) ×4 IMPLANT
TRAY FOLEY W/METER SILVER 16FR (SET/KITS/TRAYS/PACK) ×3 IMPLANT
TRAY LAPAROSCOPIC MC (CUSTOM PROCEDURE TRAY) ×1 IMPLANT
TROCAR XCEL BLUNT TIP 100MML (ENDOMECHANICALS) ×1 IMPLANT
TROCAR XCEL NON-BLD 5MMX100MML (ENDOMECHANICALS) ×1 IMPLANT
TUBING INSUFFLATION (TUBING) ×1 IMPLANT
YANKAUER SUCT BULB TIP NO VENT (SUCTIONS) IMPLANT
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
## 2017-01-31 NOTE — ED Notes (Signed)
OR consent signed by pt after procedure explained by Dr. Corliss Skainssuei

## 2017-01-31 NOTE — ED Notes (Signed)
[employer] at bedside 

## 2017-01-31 NOTE — ED Notes (Signed)
In CT at this time with this RN.
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
## 2017-01-31 NOTE — Anesthesia Preprocedure Evaluation (Signed)
Anesthesia Evaluation  Patient identified by MRN, date of birth, ID band Patient awake    Reviewed: Allergy & Precautions, NPO status , Patient's Chart, lab work & pertinent test results  Airway Mallampati: II  TM Distance: >3 FB Neck ROM: Full    Dental no notable dental hx.    Pulmonary Current Smoker,    Pulmonary exam normal breath sounds clear to auscultation       Cardiovascular negative cardio ROS Normal cardiovascular exam Rhythm:Regular Rate:Normal     Neuro/Psych negative neurological ROS  negative psych ROS   GI/Hepatic negative GI ROS, Neg liver ROS,   Endo/Other  negative endocrine ROS  Renal/GU negative Renal ROS  negative genitourinary   Musculoskeletal negative musculoskeletal ROS (+)   Abdominal   Peds negative pediatric ROS (+)  Hematology negative hematology ROS (+)   Anesthesia Other Findings   Reproductive/Obstetrics negative OB ROS                             Anesthesia Physical Anesthesia Plan  ASA: II and emergent  Anesthesia Plan: General   Post-op Pain Management:    Induction: Intravenous and Rapid sequence  PONV Risk Score and Plan: 1 and Ondansetron and Dexamethasone  Airway Management Planned: Oral ETT  Additional Equipment:   Intra-op Plan:   Post-operative Plan: Extubation in OR  Informed Consent: I have reviewed the patients History and Physical, chart, labs and discussed the procedure including the risks, benefits and alternatives for the proposed anesthesia with the patient or authorized representative who has indicated his/her understanding and acceptance.   Dental advisory given  Plan Discussed with: CRNA and Surgeon  Anesthesia Plan Comments:         Anesthesia Quick Evaluation
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
## 2017-02-01 ENCOUNTER — Encounter (HOSPITAL_COMMUNITY): Payer: Self-pay | Admitting: General Surgery

## 2017-02-01 DIAGNOSIS — R402362 Coma scale, best motor response, obeys commands, at arrival to emergency department: Secondary | ICD-10-CM | POA: Diagnosis present

## 2017-02-01 DIAGNOSIS — S31615A Laceration without foreign body of abdominal wall, periumbilic region with penetration into peritoneal cavity, initial encounter: Secondary | ICD-10-CM | POA: Diagnosis present

## 2017-02-01 DIAGNOSIS — F17210 Nicotine dependence, cigarettes, uncomplicated: Secondary | ICD-10-CM | POA: Diagnosis present

## 2017-02-01 DIAGNOSIS — S36438A Laceration of other part of small intestine, initial encounter: Secondary | ICD-10-CM | POA: Diagnosis present

## 2017-02-01 DIAGNOSIS — R402142 Coma scale, eyes open, spontaneous, at arrival to emergency department: Secondary | ICD-10-CM | POA: Diagnosis present

## 2017-02-01 DIAGNOSIS — R402252 Coma scale, best verbal response, oriented, at arrival to emergency department: Secondary | ICD-10-CM | POA: Diagnosis present

## 2017-02-01 DIAGNOSIS — S31139A Puncture wound of abdominal wall without foreign body, unspecified quadrant without penetration into peritoneal cavity, initial encounter: Secondary | ICD-10-CM | POA: Diagnosis present

## 2017-02-01 DIAGNOSIS — S31109A Unspecified open wound of abdominal wall, unspecified quadrant without penetration into peritoneal cavity, initial encounter: Secondary | ICD-10-CM | POA: Diagnosis present

## 2017-02-01 LAB — PREPARE FRESH FROZEN PLASMA
UNIT DIVISION: 0
Unit division: 0

## 2017-02-01 LAB — BASIC METABOLIC PANEL
ANION GAP: 10 (ref 5–15)
BUN: 15 mg/dL (ref 6–20)
CHLORIDE: 109 mmol/L (ref 101–111)
CO2: 17 mmol/L — ABNORMAL LOW (ref 22–32)
Calcium: 8.9 mg/dL (ref 8.9–10.3)
Creatinine, Ser: 0.8 mg/dL (ref 0.61–1.24)
Glucose, Bld: 182 mg/dL — ABNORMAL HIGH (ref 65–99)
POTASSIUM: 4.1 mmol/L (ref 3.5–5.1)
SODIUM: 136 mmol/L (ref 135–145)

## 2017-02-01 LAB — BPAM RBC
BLOOD PRODUCT EXPIRATION DATE: 201809142359
BLOOD PRODUCT EXPIRATION DATE: 201809182359
ISSUE DATE / TIME: 201809112227
ISSUE DATE / TIME: 201809112227
Unit Type and Rh: 9500
Unit Type and Rh: 9500

## 2017-02-01 LAB — TYPE AND SCREEN
ABO/RH(D): O POS
Antibody Screen: NEGATIVE
UNIT DIVISION: 0
UNIT DIVISION: 0

## 2017-02-01 LAB — CBC
HEMATOCRIT: 44.7 % (ref 39.0–52.0)
Hemoglobin: 15.6 g/dL (ref 13.0–17.0)
MCH: 30.1 pg (ref 26.0–34.0)
MCHC: 34.9 g/dL (ref 30.0–36.0)
MCV: 86.1 fL (ref 78.0–100.0)
Platelets: 256 10*3/uL (ref 150–400)
RBC: 5.19 MIL/uL (ref 4.22–5.81)
RDW: 12.6 % (ref 11.5–15.5)
WBC: 16.8 10*3/uL — ABNORMAL HIGH (ref 4.0–10.5)

## 2017-02-01 LAB — BPAM FFP
BLOOD PRODUCT EXPIRATION DATE: 201809192359
BLOOD PRODUCT EXPIRATION DATE: 201809242359
ISSUE DATE / TIME: 201809112227
ISSUE DATE / TIME: 201809112227
UNIT TYPE AND RH: 6200
Unit Type and Rh: 600

## 2017-02-01 LAB — BLOOD PRODUCT ORDER (VERBAL) VERIFICATION

## 2017-02-01 MED ORDER — KETOROLAC TROMETHAMINE 30 MG/ML IJ SOLN
INTRAMUSCULAR | Status: AC
  Filled 2017-02-01: qty 1

## 2017-02-01 MED ORDER — ONDANSETRON 4 MG PO TBDP: 4.0000 mg | ORAL_TABLET | Freq: Four times a day (QID) | ORAL | Status: DC | PRN

## 2017-02-01 MED ORDER — OXYCODONE HCL 5 MG PO TABS
5.0000 mg | ORAL_TABLET | ORAL | Status: DC | PRN
  Administered 2017-02-01: 5 mg via ORAL
  Administered 2017-02-01: 10 mg via ORAL
  Filled 2017-02-01: qty 1
  Filled 2017-02-01 (×2): qty 2

## 2017-02-01 MED ORDER — KETOROLAC TROMETHAMINE 30 MG/ML IJ SOLN
30.0000 mg | Freq: Four times a day (QID) | INTRAMUSCULAR | Status: DC
  Administered 2017-02-01 – 2017-02-02 (×7): 30 mg via INTRAVENOUS
  Filled 2017-02-01 (×6): qty 1

## 2017-02-01 MED ORDER — HYDROMORPHONE HCL 1 MG/ML IJ SOLN
INTRAMUSCULAR | Status: AC
  Filled 2017-02-01: qty 2

## 2017-02-01 MED ORDER — SODIUM CHLORIDE 0.9 % IV SOLN
INTRAVENOUS | Status: DC
  Administered 2017-02-01 – 2017-02-02 (×4): via INTRAVENOUS

## 2017-02-01 MED ORDER — 0.9 % SODIUM CHLORIDE (POUR BTL) OPTIME
TOPICAL | Status: DC | PRN
  Administered 2017-02-01: 2000 mL

## 2017-02-01 MED ORDER — ENOXAPARIN SODIUM 40 MG/0.4ML ~~LOC~~ SOLN
40.0000 mg | SUBCUTANEOUS | Status: DC
  Administered 2017-02-01: 40 mg via SUBCUTANEOUS
  Filled 2017-02-01: qty 0.4

## 2017-02-01 MED ORDER — ONDANSETRON HCL 4 MG/2ML IJ SOLN: 4.0000 mg | Freq: Four times a day (QID) | INTRAMUSCULAR | Status: DC | PRN

## 2017-02-01 MED ORDER — SIMETHICONE 80 MG PO CHEW: 40.0000 mg | CHEWABLE_TABLET | Freq: Four times a day (QID) | ORAL | Status: DC | PRN

## 2017-02-01 MED ORDER — ROCURONIUM BROMIDE 10 MG/ML (PF) SYRINGE
PREFILLED_SYRINGE | INTRAVENOUS | Status: AC
  Filled 2017-02-01: qty 5

## 2017-02-01 MED ORDER — SODIUM CHLORIDE 0.9 % IR SOLN
Status: DC | PRN
  Administered 2017-02-01: 1000 mL

## 2017-02-01 MED ORDER — HYDROMORPHONE HCL 1 MG/ML IJ SOLN
1.0000 mg | INTRAMUSCULAR | Status: DC | PRN
  Administered 2017-02-01 (×5): 1 mg via INTRAVENOUS
  Filled 2017-02-01 (×5): qty 1

## 2017-02-01 MED ORDER — DIPHENHYDRAMINE HCL 50 MG/ML IJ SOLN: 25.0000 mg | Freq: Four times a day (QID) | INTRAMUSCULAR | Status: DC | PRN

## 2017-02-01 MED ORDER — KETOROLAC TROMETHAMINE 30 MG/ML IJ SOLN: 30.0000 mg | Freq: Four times a day (QID) | INTRAMUSCULAR | Status: DC | PRN

## 2017-02-01 MED ORDER — LIDOCAINE 2% (20 MG/ML) 5 ML SYRINGE
INTRAMUSCULAR | Status: AC
  Filled 2017-02-01: qty 5

## 2017-02-01 MED ORDER — METHOCARBAMOL 500 MG PO TABS
500.0000 mg | ORAL_TABLET | Freq: Four times a day (QID) | ORAL | Status: DC | PRN
  Administered 2017-02-01: 500 mg via ORAL
  Filled 2017-02-01: qty 1

## 2017-02-01 MED ORDER — DEXAMETHASONE SODIUM PHOSPHATE 10 MG/ML IJ SOLN
INTRAMUSCULAR | Status: DC | PRN
  Administered 2017-02-01: 10 mg via INTRAVENOUS

## 2017-02-01 MED ORDER — TRAMADOL HCL 50 MG PO TABS: 50.0000 mg | ORAL_TABLET | Freq: Four times a day (QID) | ORAL | Status: DC | PRN

## 2017-02-01 MED ORDER — ONDANSETRON HCL 4 MG/2ML IJ SOLN
INTRAMUSCULAR | Status: AC
  Filled 2017-02-01: qty 2

## 2017-02-01 MED ORDER — DEXTROSE 5 % IV SOLN
2.0000 g | INTRAVENOUS | Status: DC
  Administered 2017-02-02: 2 g via INTRAVENOUS
  Filled 2017-02-01 (×3): qty 2

## 2017-02-01 MED ORDER — DIPHENHYDRAMINE HCL 25 MG PO CAPS: 25.0000 mg | ORAL_CAPSULE | Freq: Four times a day (QID) | ORAL | Status: DC | PRN

## 2017-02-01 MED ORDER — METRONIDAZOLE IN NACL 5-0.79 MG/ML-% IV SOLN
500.0000 mg | Freq: Three times a day (TID) | INTRAVENOUS | Status: DC
  Administered 2017-02-01 – 2017-02-02 (×5): 500 mg via INTRAVENOUS
  Filled 2017-02-01 (×4): qty 100

## 2017-02-01 MED ORDER — PROMETHAZINE HCL 25 MG/ML IJ SOLN: 6.2500 mg | INTRAMUSCULAR | Status: DC | PRN

## 2017-02-01 MED ORDER — ACETAMINOPHEN 325 MG PO TABS: 650.0000 mg | ORAL_TABLET | Freq: Four times a day (QID) | ORAL | Status: DC | PRN

## 2017-02-01 MED ORDER — SUCCINYLCHOLINE CHLORIDE 200 MG/10ML IV SOSY
PREFILLED_SYRINGE | INTRAVENOUS | Status: AC
  Filled 2017-02-01: qty 10

## 2017-02-01 MED ORDER — ONDANSETRON HCL 4 MG/2ML IJ SOLN
INTRAMUSCULAR | Status: DC | PRN
  Administered 2017-02-01: 4 mg via INTRAVENOUS

## 2017-02-01 MED ORDER — SUGAMMADEX SODIUM 500 MG/5ML IV SOLN
INTRAVENOUS | Status: AC
  Filled 2017-02-01: qty 5

## 2017-02-01 MED ORDER — PANTOPRAZOLE SODIUM 40 MG IV SOLR
40.0000 mg | Freq: Every day | INTRAVENOUS | Status: DC
  Administered 2017-02-01 (×2): 40 mg via INTRAVENOUS
  Filled 2017-02-01 (×2): qty 40

## 2017-02-01 MED ORDER — SUGAMMADEX SODIUM 500 MG/5ML IV SOLN
INTRAVENOUS | Status: DC | PRN
  Administered 2017-02-01: 500 mg via INTRAVENOUS

## 2017-02-01 MED ORDER — ACETAMINOPHEN 650 MG RE SUPP: 650.0000 mg | Freq: Four times a day (QID) | RECTAL | Status: DC | PRN

## 2017-02-01 MED ORDER — DEXAMETHASONE SODIUM PHOSPHATE 10 MG/ML IJ SOLN
INTRAMUSCULAR | Status: AC
  Filled 2017-02-01: qty 1

## 2017-02-01 MED ORDER — DOCUSATE SODIUM 100 MG PO CAPS
100.0000 mg | ORAL_CAPSULE | Freq: Two times a day (BID) | ORAL | Status: DC
  Administered 2017-02-01 – 2017-02-02 (×3): 100 mg via ORAL
  Filled 2017-02-01 (×3): qty 1

## 2017-02-01 MED ORDER — HYDROMORPHONE HCL 1 MG/ML IJ SOLN
0.2500 mg | INTRAMUSCULAR | Status: DC | PRN
  Administered 2017-02-01 (×4): 0.5 mg via INTRAVENOUS
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
## 2017-02-01 NOTE — Anesthesia Procedure Notes (Signed)
Procedure Name: Intubation Date/Time: 02/01/2017 11:51 PM Performed by: Manuela Schwartz B Pre-anesthesia Checklist: Patient identified, Emergency Drugs available, Suction available and Patient being monitored Patient Re-evaluated:Patient Re-evaluated prior to induction Oxygen Delivery Method: Circle System Utilized Preoxygenation: Pre-oxygenation with 100% oxygen Induction Type: IV induction, Rapid sequence and Cricoid Pressure applied Ventilation: Mask ventilation without difficulty Laryngoscope Size: Mac and 3 Grade View: Grade I Tube type: Oral Tube size: 7.5 mm Number of attempts: 1 Airway Equipment and Method: Stylet and Oral airway Placement Confirmation: ETT inserted through vocal cords under direct vision,  positive ETCO2 and breath sounds checked- equal and bilateral Secured at: 23 cm Tube secured with: Tape Dental Injury: Teeth and Oropharynx as per pre-operative assessment
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
## 2017-02-01 NOTE — Transfer of Care (Signed)
Immediate Anesthesia Transfer of Care Note  Patient: Kevin Fox  Procedure(s) Performed: Procedure(s): EXPLORATORY LAPAROTOMY small bowel repair (N/A)  Patient Location: PACU  Anesthesia Type:General  Level of Consciousness: awake, alert  and oriented  Airway & Oxygen Therapy: Patient Spontanous Breathing and Patient connected to nasal cannula oxygen  Post-op Assessment: Report given to RN and Post -op Vital signs reviewed and stable  Post vital signs: Reviewed and stable  Last Vitals:  Vitals:   01/31/17 2315 01/31/17 2330  BP: (!) 143/92 (!) 152/93  Pulse: 88 89  Resp: (!) 23 18  SpO2: 99% 98%    Last Pain:  Vitals:   01/31/17 2338  PainSc: 5          Complications: No apparent anesthesia complications
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
## 2017-02-02 LAB — CBC
HEMATOCRIT: 38.9 % — AB (ref 39.0–52.0)
HEMOGLOBIN: 13.2 g/dL (ref 13.0–17.0)
MCH: 29.5 pg (ref 26.0–34.0)
MCHC: 33.9 g/dL (ref 30.0–36.0)
MCV: 87 fL (ref 78.0–100.0)
Platelets: 269 10*3/uL (ref 150–400)
RBC: 4.47 MIL/uL (ref 4.22–5.81)
RDW: 12.7 % (ref 11.5–15.5)
WBC: 12.5 10*3/uL — AB (ref 4.0–10.5)

## 2017-02-02 LAB — BASIC METABOLIC PANEL
ANION GAP: 5 (ref 5–15)
BUN: 13 mg/dL (ref 6–20)
CHLORIDE: 107 mmol/L (ref 101–111)
CO2: 27 mmol/L (ref 22–32)
Calcium: 8.2 mg/dL — ABNORMAL LOW (ref 8.9–10.3)
Creatinine, Ser: 0.61 mg/dL (ref 0.61–1.24)
GFR calc Af Amer: >60 mL/min (ref >60)
GFR calc non Af Amer: >60 mL/min (ref >60)
Glucose, Bld: 113 mg/dL — ABNORMAL HIGH (ref 65–99)
Potassium: 3.6 mmol/L (ref 3.5–5.1)
SODIUM: 139 mmol/L (ref 135–145)

## 2017-02-02 MED ORDER — CIPROFLOXACIN HCL 500 MG PO TABS: 500.0000 mg | ORAL_TABLET | Freq: Two times a day (BID) | ORAL | 0 refills | Status: DC

## 2017-02-02 MED ORDER — OXYCODONE HCL 5 MG PO TABS: 5.0000 mg | ORAL_TABLET | ORAL | 0 refills | Status: DC | PRN

## 2017-02-02 MED ORDER — METHOCARBAMOL 500 MG PO TABS: 500.0000 mg | ORAL_TABLET | Freq: Four times a day (QID) | ORAL | 0 refills | Status: DC | PRN

## 2017-02-02 MED ORDER — METRONIDAZOLE 500 MG PO TABS: 500.0000 mg | ORAL_TABLET | Freq: Two times a day (BID) | ORAL | 0 refills | Status: DC

## 2017-02-02 NOTE — Anesthesia Postprocedure Evaluation (Signed)
Anesthesia Post Note  Patient: Kevin LollingJuan Pablo Fox  Procedure(s) Performed: Procedure(s) (LRB): EXPLORATORY LAPAROTOMY small bowel repair (N/A)     Patient location during evaluation: PACU Anesthesia Type: General Level of consciousness: awake and alert Pain management: pain level controlled Vital Signs Assessment: post-procedure vital signs reviewed and stable Respiratory status: spontaneous breathing, nonlabored ventilation, respiratory function stable and patient connected to nasal cannula oxygen Cardiovascular status: blood pressure returned to baseline and stable Postop Assessment: no signs of nausea or vomiting Anesthetic complications: no    Last Vitals:  Vitals:   02/01/17 2058 02/02/17 0530  BP: 127/69 126/70  Pulse: 74 (!) 59  Resp: 18 17  Temp: 37.4 C 36.9 C  SpO2: 98% 98%    Last Pain:  Vitals:   02/02/17 0530  TempSrc: Oral  PainSc:                  Stassi Fadely S

## 2017-02-02 NOTE — Discharge Instructions (Signed)
[facility]      [facility], [place] [phone]  OPEN ABDOMINAL SURGERY: POST OP INSTRUCTIONS  Always review your discharge instruction sheet given to you by the facility where your surgery was performed.  IF YOU HAVE DISABILITY OR FAMILY LEAVE FORMS, YOU MUST BRING THEM TO THE OFFICE FOR PROCESSING.  PLEASE DO NOT GIVE THEM TO YOUR DOCTOR.  1. A prescription for pain medication may be given to you upon discharge.  Take your pain medication as prescribed, if needed.  If narcotic pain medicine is not needed, then you may take acetaminophen (Tylenol) or ibuprofen (Advil) as needed. 2. Take your usually prescribed medications unless otherwise directed. 3. If you need a refill on your pain medication, please contact your pharmacy. They will contact our office to request authorization.  Prescriptions will not be filled after 5pm or on week-ends. 4. You should follow a light diet the first few days after arrival home, such as soup and crackers, pudding, etc.unless your doctor has advised otherwise. A high-fiber, low fat diet can be resumed as tolerated.   Be sure to include lots of fluids daily. Most patients will experience some swelling and bruising on the chest and neck area.  Ice packs will help.  Swelling and bruising can take several days to resolve 5. Most patients will experience some swelling and bruising in the area of the incision. Ice pack will help. Swelling and bruising can take several days to resolve..  6. It is common to experience some constipation if taking pain medication after surgery.  Increasing fluid intake and taking a stool softener will usually help or prevent this problem from occurring.  A mild laxative (Milk of Magnesia or Miralax) should be taken according to package directions if there are no bowel movements after 48 hours. 7.  Any sutures or staples will be removed at the office during your follow-up visit. You may find that a light gauze bandage over your incision may  keep your staples from being rubbed or pulled. You may shower and replace the bandage daily. 8. ACTIVITIES:  You may resume regular (light) daily activities beginning the next day--such as daily self-care, walking, climbing stairs--gradually increasing activities as tolerated.  You may have sexual intercourse when it is comfortable.  Refrain from any heavy lifting or straining until approved by your doctor. a. You may drive when you no longer are taking prescription pain medication, you can comfortably wear a seatbelt, and you can safely maneuver your car and apply brakes 9. You should see your doctor in the office for a follow-up appointment approximately two weeks after your surgery.  Make sure that you call for this appointment within a day or two after you arrive home to insure a convenient appointment time.  WHEN TO CALL YOUR DOCTOR: 1. Fever over 101.0 2. Inability to urinate 3. Nausea and/or vomiting 4. Extreme swelling or bruising 5. Continued bleeding from incision. 6. Increased pain, redness, or drainage from the incision. 7. Difficulty swallowing or breathing 8. Muscle cramping or spasms. 9. Numbness or tingling in hands or feet or around lips.  The clinic staff is available to answer your questions during regular business hours.  Please dont hesitate to call and ask to speak to one of the nurses if you have concerns.  For further questions, please visit [email or web address]
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
## 2017-02-02 NOTE — Progress Notes (Signed)
Kevin Fox to be D/C'd home per MD order.  Discussed with the patient and all questions fully answered.  VSS, Skin clean, dry and intact without evidence of skin break down, no evidence of skin tears noted. IV catheter discontinued intact. Site without signs and symptoms of complications. Dressing and pressure applied.  An After Visit Summary was printed and given to the patient. Patient received prescription.  D/c education completed with patient/family including follow up instructions, medication list, d/c activities limitations if indicated, with other d/c instructions as indicated by MD - patient able to verbalize understanding, all questions fully answered.   Patient instructed to return to ED, call 911, or call MD for any changes in condition.   Patient escorted via WC, and D/C home via private auto.  Kevin Fox 02/02/2017 5:20 PM
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
## 2017-02-06 ENCOUNTER — Encounter (HOSPITAL_COMMUNITY): Payer: Self-pay | Admitting: Emergency Medicine
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
## 2017-04-07 ENCOUNTER — Encounter: Payer: Self-pay | Admitting: Family Medicine

## 2017-04-07 ENCOUNTER — Ambulatory Visit (INDEPENDENT_AMBULATORY_CARE_PROVIDER_SITE_OTHER): Payer: Self-pay | Admitting: Family Medicine

## 2017-04-07 VITALS — BP 129/74 | HR 62 | Temp 98.4°F | Resp 14 | Ht 66.0 in | Wt 229.0 lb

## 2017-04-07 DIAGNOSIS — R1031 Right lower quadrant pain: Secondary | ICD-10-CM

## 2017-04-07 DIAGNOSIS — E8881 Metabolic syndrome: Secondary | ICD-10-CM

## 2017-04-07 LAB — POCT GLYCOSYLATED HEMOGLOBIN (HGB A1C): HEMOGLOBIN A1C: 5.2
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
## 2017-04-07 NOTE — Patient Instructions (Addendum)
Will schedule follow up after reviewing stat abdominal ultrasound.  Will also follow up by phone with any abnormal lab results Abdominal Pain, Adult Many things can cause belly (abdominal) pain. Most times, belly pain is not dangerous. Many cases of belly pain can be watched and treated at home. Sometimes belly pain is serious, though. Your doctor will try to find the cause of your belly pain. Follow these instructions at home:  Take over-the-counter and prescription medicines only as told by your doctor. Do not take medicines that help you poop (laxatives) unless told to by your doctor.  Drink enough fluid to keep your pee (urine) clear or pale yellow.  Watch your belly pain for any changes.  Keep all follow-up visits as told by your doctor. This is important. Contact a doctor if:  Your belly pain changes or gets worse.  You are not hungry, or you lose weight without trying.  You are having trouble pooping (constipated) or have watery poop (diarrhea) for more than 2-3 days.  You have pain when you pee or poop.  Your belly pain wakes you up at night.  Your pain gets worse with meals, after eating, or with certain foods.  You are throwing up and cannot keep anything down.  You have a fever. Get help right away if:  Your pain does not go away as soon as your doctor says it should.  You cannot stop throwing up.  Your pain is only in areas of your belly, such as the right side or the left lower part of the belly.  You have bloody or black poop, or poop that looks like tar.  You have very bad pain, cramping, or bloating in your belly.  You have signs of not having enough fluid or water in your body (dehydration), such as: ? Dark pee, very little pee, or no pee. ? Cracked lips. ? Dry mouth. ? Sunken eyes. ? Sleepiness. ? Weakness. This information is not intended to replace advice given to you by your health care provider. Make sure you discuss any questions you have with  your health care provider. Document Released: 10/26/2007 Document Revised: 11/27/2015 Document Reviewed: 10/21/2015 Elsevier Interactive Patient Education  2017 [employer]

## 2017-04-10 ENCOUNTER — Ambulatory Visit: Payer: Self-pay | Attending: Internal Medicine

## 2017-04-10 LAB — HEPATITIS PANEL, ACUTE
HEP A IGM: NONREACTIVE
HEP B C IGM: NONREACTIVE
HEP B S AG: NONREACTIVE
Hepatitis C Ab: NONREACTIVE
SIGNAL TO CUT-OFF: 0.02 (ref <1.00)

## 2017-04-10 LAB — CBC WITH DIFFERENTIAL/PLATELET
BASOS PCT: 0.5 %
Basophils Absolute: 44 cells/uL (ref 0–200)
EOS ABS: 361 {cells}/uL (ref 15–500)
Eosinophils Relative: 4.1 %
HEMATOCRIT: 46.5 % (ref 38.5–50.0)
HEMOGLOBIN: 16 g/dL (ref 13.2–17.1)
Lymphs Abs: 3626 cells/uL (ref 850–3900)
MCH: 30.1 pg (ref 27.0–33.0)
MCHC: 34.4 g/dL (ref 32.0–36.0)
MCV: 87.6 fL (ref 80.0–100.0)
MPV: 10.1 fL (ref 7.5–12.5)
Monocytes Relative: 6.6 %
NEUTROS ABS: 4189 {cells}/uL (ref 1500–7800)
Neutrophils Relative %: 47.6 %
Platelets: 316 10*3/uL (ref 140–400)
RBC: 5.31 10*6/uL (ref 4.20–5.80)
RDW: 12.7 % (ref 11.0–15.0)
Total Lymphocyte: 41.2 %
WBC mixed population: 581 cells/uL (ref 200–950)
WBC: 8.8 10*3/uL (ref 3.8–10.8)

## 2017-04-10 LAB — COMPLETE METABOLIC PANEL WITH GFR
AG RATIO: 1.8 (calc) (ref 1.0–2.5)
ALBUMIN MSPROF: 4.8 g/dL (ref 3.6–5.1)
ALT: 108 U/L — ABNORMAL HIGH (ref 9–46)
AST: 46 U/L — ABNORMAL HIGH (ref 10–40)
Alkaline phosphatase (APISO): 73 U/L (ref 40–115)
BILIRUBIN TOTAL: 0.6 mg/dL (ref 0.2–1.2)
BUN: 18 mg/dL (ref 7–25)
CALCIUM: 9.6 mg/dL (ref 8.6–10.3)
CO2: 26 mmol/L (ref 20–32)
Chloride: 107 mmol/L (ref 98–110)
Creat: 0.74 mg/dL (ref 0.60–1.35)
GFR, EST AFRICAN AMERICAN: 151 mL/min/{1.73_m2} (ref >=60)
GFR, EST NON AFRICAN AMERICAN: 130 mL/min/{1.73_m2} (ref >=60)
GLOBULIN: 2.6 g/dL (ref 1.9–3.7)
Glucose, Bld: 105 mg/dL — ABNORMAL HIGH (ref 65–99)
POTASSIUM: 4.4 mmol/L (ref 3.5–5.3)
SODIUM: 140 mmol/L (ref 135–146)
TOTAL PROTEIN: 7.4 g/dL (ref 6.1–8.1)

## 2017-04-10 LAB — LIPID PANEL
Cholesterol: 151 mg/dL (ref <200)
HDL: 34 mg/dL — ABNORMAL LOW (ref >40)
LDL Cholesterol (Calc): 90 mg/dL (calc)
NON-HDL CHOLESTEROL (CALC): 117 mg/dL (ref <130)
Total CHOL/HDL Ratio: 4.4 (calc) (ref <5.0)
Triglycerides: 175 mg/dL — ABNORMAL HIGH (ref <150)

## 2017-04-10 LAB — TEST AUTHORIZATION

## 2017-04-11 ENCOUNTER — Ambulatory Visit (HOSPITAL_COMMUNITY)
Admission: RE | Admit: 2017-04-11 | Discharge: 2017-04-11 | Disposition: A | Discharge status: Home / Self Care | Payer: Self-pay | Source: Ambulatory Visit | Attending: Family Medicine | Admitting: Family Medicine

## 2017-04-11 DIAGNOSIS — R932 Abnormal findings on diagnostic imaging of liver and biliary tract: Secondary | ICD-10-CM | POA: Insufficient documentation

## 2017-04-11 DIAGNOSIS — R1031 Right lower quadrant pain: Secondary | ICD-10-CM | POA: Insufficient documentation

## 2018-05-24 ENCOUNTER — Other Ambulatory Visit: Payer: Self-pay

## 2018-05-24 ENCOUNTER — Ambulatory Visit (HOSPITAL_COMMUNITY)
Admission: EM | Admit: 2018-05-24 | Discharge: 2018-05-24 | Disposition: A | Discharge status: Home / Self Care | Payer: Self-pay | Attending: Family Medicine | Admitting: Family Medicine

## 2018-05-24 ENCOUNTER — Encounter (HOSPITAL_COMMUNITY): Payer: Self-pay | Admitting: Emergency Medicine

## 2018-05-24 DIAGNOSIS — M549 Dorsalgia, unspecified: Secondary | ICD-10-CM | POA: Insufficient documentation

## 2018-05-24 DIAGNOSIS — J111 Influenza due to unidentified influenza virus with other respiratory manifestations: Secondary | ICD-10-CM

## 2018-05-24 DIAGNOSIS — Z87891 Personal history of nicotine dependence: Secondary | ICD-10-CM | POA: Insufficient documentation

## 2018-05-24 DIAGNOSIS — R509 Fever, unspecified: Secondary | ICD-10-CM | POA: Insufficient documentation

## 2018-05-24 DIAGNOSIS — R69 Illness, unspecified: Secondary | ICD-10-CM

## 2018-05-24 LAB — POCT RAPID STREP A: Streptococcus, Group A Screen (Direct): NEGATIVE

## 2018-05-24 MED ORDER — ACETAMINOPHEN 325 MG PO TABS
ORAL_TABLET | ORAL | Status: AC
Start: 2018-05-24 — End: ?
  Filled 2018-05-24: qty 2

## 2018-05-24 MED ORDER — IBUPROFEN 800 MG PO TABS: 800.0000 mg | ORAL_TABLET | Freq: Three times a day (TID) | ORAL | 0 refills | Status: DC

## 2018-05-24 MED ORDER — ACETAMINOPHEN 325 MG PO TABS
650.0000 mg | ORAL_TABLET | Freq: Once | ORAL | Status: AC
  Administered 2018-05-24: 650 mg via ORAL

## 2018-05-24 MED ORDER — OSELTAMIVIR PHOSPHATE 75 MG PO CAPS: 75.0000 mg | ORAL_CAPSULE | Freq: Two times a day (BID) | ORAL | 0 refills | Status: DC
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
## 2018-05-24 NOTE — Discharge Instructions (Addendum)
Rest Push fluids Take the tamiflu 2 x a day Take 2 doses today Take the ibuprofen 3 x a day with food Return if needed

## 2018-05-26 LAB — CULTURE, GROUP A STREP (THRC)

## 2018-05-27 ENCOUNTER — Emergency Department (HOSPITAL_COMMUNITY)
Admission: EM | Admit: 2018-05-27 | Discharge: 2018-05-27 | Disposition: A | Discharge status: Home / Self Care | Payer: Self-pay | Attending: Emergency Medicine | Admitting: Emergency Medicine

## 2018-05-27 ENCOUNTER — Encounter (HOSPITAL_COMMUNITY): Payer: Self-pay

## 2018-05-27 ENCOUNTER — Emergency Department (HOSPITAL_COMMUNITY): Payer: Self-pay

## 2018-05-27 ENCOUNTER — Other Ambulatory Visit: Payer: Self-pay

## 2018-05-27 DIAGNOSIS — Z87891 Personal history of nicotine dependence: Secondary | ICD-10-CM | POA: Insufficient documentation

## 2018-05-27 DIAGNOSIS — R197 Diarrhea, unspecified: Secondary | ICD-10-CM | POA: Insufficient documentation

## 2018-05-27 DIAGNOSIS — J111 Influenza due to unidentified influenza virus with other respiratory manifestations: Secondary | ICD-10-CM | POA: Insufficient documentation

## 2018-05-27 DIAGNOSIS — R69 Illness, unspecified: Secondary | ICD-10-CM

## 2018-05-27 LAB — URINALYSIS, ROUTINE W REFLEX MICROSCOPIC
Bilirubin Urine: NEGATIVE
Glucose, UA: NEGATIVE mg/dL
Hgb urine dipstick: NEGATIVE
KETONES UR: 5 mg/dL — AB
Leukocytes, UA: NEGATIVE
Nitrite: NEGATIVE
PH: 5 (ref 5.0–8.0)
Protein, ur: 30 mg/dL — AB
Specific Gravity, Urine: 1.027 (ref 1.005–1.030)

## 2018-05-27 LAB — CBC
HCT: 49.5 % (ref 39.0–52.0)
Hemoglobin: 17.2 g/dL — ABNORMAL HIGH (ref 13.0–17.0)
MCH: 29.5 pg (ref 26.0–34.0)
MCHC: 34.7 g/dL (ref 30.0–36.0)
MCV: 84.8 fL (ref 80.0–100.0)
Platelets: 215 10*3/uL (ref 150–400)
RBC: 5.84 MIL/uL — ABNORMAL HIGH (ref 4.22–5.81)
RDW: 12.3 % (ref 11.5–15.5)
WBC: 5.9 10*3/uL (ref 4.0–10.5)
nRBC: 0 % (ref 0.0–0.2)

## 2018-05-27 LAB — BASIC METABOLIC PANEL
Anion gap: 13 (ref 5–15)
BUN: 17 mg/dL (ref 6–20)
CO2: 20 mmol/L — ABNORMAL LOW (ref 22–32)
Calcium: 9.3 mg/dL (ref 8.9–10.3)
Chloride: 104 mmol/L (ref 98–111)
Creatinine, Ser: 0.88 mg/dL (ref 0.61–1.24)
GFR calc Af Amer: >60 mL/min (ref >60)
GFR calc non Af Amer: >60 mL/min (ref >60)
Glucose, Bld: 98 mg/dL (ref 70–99)
Potassium: 3.6 mmol/L (ref 3.5–5.1)
SODIUM: 137 mmol/L (ref 135–145)

## 2018-05-27 MED ORDER — SODIUM CHLORIDE 0.9 % IV BOLUS
1000.0000 mL | Freq: Once | INTRAVENOUS | Status: AC
  Administered 2018-05-27: 1000 mL via INTRAVENOUS

## 2018-05-27 NOTE — ED Triage Notes (Signed)
Pt arrives POV for eval of cough w/ chest pain when coughing. Pt endorses congestion x 2 days. States he has been having decreased urine output x 2 days. Endorses decreased appetite, states he has been drinking water normal amount.

## 2018-05-27 NOTE — ED Notes (Signed)
ED Provider at bedside. 

## 2018-05-27 NOTE — ED Notes (Signed)
Patient verbalizes understanding of discharge instructions. Opportunity for questioning and answers were provided. Armband removed by staff, pt discharged from ED. Pt ambulatory to lobby.  

## 2018-05-27 NOTE — Discharge Instructions (Signed)
Continue taking ibuprofen for aches and pains.  You can also take over-the-counter Imodium to help with your diarrhea.  Your symptoms should be improving over the next week
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
## 2018-07-20 ENCOUNTER — Ambulatory Visit: Payer: Self-pay | Attending: Family Medicine

## 2018-08-28 ENCOUNTER — Ambulatory Visit (HOSPITAL_COMMUNITY)
Admission: EM | Admit: 2018-08-28 | Discharge: 2018-08-28 | Disposition: A | Discharge status: Home / Self Care | Payer: Self-pay | Attending: Family Medicine | Admitting: Family Medicine

## 2018-08-28 ENCOUNTER — Encounter (HOSPITAL_COMMUNITY): Payer: Self-pay | Admitting: Emergency Medicine

## 2018-08-28 ENCOUNTER — Other Ambulatory Visit: Payer: Self-pay

## 2018-08-28 DIAGNOSIS — R030 Elevated blood-pressure reading, without diagnosis of hypertension: Secondary | ICD-10-CM

## 2018-08-28 DIAGNOSIS — H6121 Impacted cerumen, right ear: Secondary | ICD-10-CM

## 2018-08-28 MED ORDER — CARBAMIDE PEROXIDE 6.5 % OT SOLN: 5.0000 [drp] | Freq: Two times a day (BID) | OTIC | 0 refills | Status: DC
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
## 2018-08-28 NOTE — ED Notes (Signed)
Patient able to ambulate independently  

## 2018-08-28 NOTE — Discharge Instructions (Addendum)
Finish taking the amoxicillin you were prescribed several days ago.

## 2018-08-28 NOTE — ED Triage Notes (Addendum)
Pt presents to [facility] for assessment of several months of right ear itching.  Patient states he has been cleaning them a lot due to this.  Pt states now he is having trouble hearing out of his right ear.  Pt states he has intermittent episodes where it clears up.  Pt was seen on evisit, prescribed amoxicillin and is on day 4.

## 2018-09-10 ENCOUNTER — Ambulatory Visit: Payer: Self-pay | Admitting: Family Medicine

## 2018-10-23 ENCOUNTER — Telehealth: Payer: Self-pay | Admitting: *Deleted

## 2018-10-23 NOTE — Telephone Encounter (Signed)

## 2018-10-23 NOTE — Progress Notes (Signed)
Per pt he is here for Blood Pressure Check follow up and sign up for orange. Per pt he is trying to establish care as well. Per patient when he was smaller he had blood pressure issues and don't know if it's coming back. Per patient he is not taking anything for his blood pressure. Per pt his father have blood pressure issues.

## 2018-10-24 ENCOUNTER — Encounter: Payer: Self-pay | Admitting: Family Medicine

## 2018-10-24 ENCOUNTER — Other Ambulatory Visit: Payer: Self-pay

## 2018-10-24 ENCOUNTER — Ambulatory Visit: Payer: Self-pay | Attending: Family Medicine | Admitting: Family Medicine

## 2018-10-24 VITALS — BP 148/83 | HR 80 | Temp 98.3°F | Ht 67.0 in | Wt 232.0 lb

## 2018-10-24 DIAGNOSIS — Z6836 Body mass index (BMI) 36.0-36.9, adult: Secondary | ICD-10-CM

## 2018-10-24 DIAGNOSIS — E6609 Other obesity due to excess calories: Secondary | ICD-10-CM

## 2018-10-24 DIAGNOSIS — E66812 Obesity, class 2: Secondary | ICD-10-CM

## 2018-10-24 DIAGNOSIS — R030 Elevated blood-pressure reading, without diagnosis of hypertension: Secondary | ICD-10-CM

## 2018-10-24 NOTE — Patient Instructions (Signed)
Hypertension  Hypertension is another name for high blood pressure. High blood pressure forces your heart to work harder to pump blood. This can cause problems over time.  There are two numbers in a blood pressure reading. There is a top number (systolic) over a bottom number (diastolic). It is best to have a blood pressure below 120/80. Healthy choices can help lower your blood pressure. You may need medicine to help lower your blood pressure if:   Your blood pressure cannot be lowered with healthy choices.   Your blood pressure is higher than 130/80.  Follow these instructions at home:  Eating and drinking     If directed, follow the DASH eating plan. This diet includes:  ? Filling half of your plate at each meal with fruits and vegetables.  ? Filling one quarter of your plate at each meal with whole grains. Whole grains include whole wheat pasta, brown rice, and whole grain bread.  ? Eating or drinking low-fat dairy products, such as skim milk or low-fat yogurt.  ? Filling one quarter of your plate at each meal with low-fat (lean) proteins. Low-fat proteins include fish, skinless chicken, eggs, beans, and tofu.  ? Avoiding fatty meat, cured and processed meat, or chicken with skin.  ? Avoiding premade or processed food.   Eat less than 1,500 mg of salt (sodium) a day.   Limit alcohol use to no more than 1 drink a day for nonpregnant women and 2 drinks a day for men. One drink equals 12 oz of beer, 5 oz of wine, or 1 oz of hard liquor.  Lifestyle   Work with your doctor to stay at a healthy weight or to lose weight. Ask your doctor what the best weight is for you.   Get at least 30 minutes of exercise that causes your heart to beat faster (aerobic exercise) most days of the week. This may include walking, swimming, or biking.   Get at least 30 minutes of exercise that strengthens your muscles (resistance exercise) at least 3 days a week. This may include lifting weights or pilates.   Do not use any  products that contain nicotine or tobacco. This includes cigarettes and e-cigarettes. If you need help quitting, ask your doctor.   Check your blood pressure at home as told by your doctor.   Keep all follow-up visits as told by your doctor. This is important.  Medicines   Take over-the-counter and prescription medicines only as told by your doctor. Follow directions carefully.   Do not skip doses of blood pressure medicine. The medicine does not work as well if you skip doses. Skipping doses also puts you at risk for problems.   Ask your doctor about side effects or reactions to medicines that you should watch for.  Contact a doctor if:   You think you are having a reaction to the medicine you are taking.   You have headaches that keep coming back (recurring).   You feel dizzy.   You have swelling in your ankles.   You have trouble with your vision.  Get help right away if:   You get a very bad headache.   You start to feel confused.   You feel weak or numb.   You feel faint.   You get very bad pain in your:  ? Chest.  ? Belly (abdomen).   You throw up (vomit) more than once.   You have trouble breathing.  Summary   Hypertension is another   name for high blood pressure.   Making healthy choices can help lower blood pressure. If your blood pressure cannot be controlled with healthy choices, you may need to take medicine.  This information is not intended to replace advice given to you by your health care provider. Make sure you discuss any questions you have with your health care provider.  Document Released: 10/26/2007 Document Revised: 04/06/2016 Document Reviewed: 04/06/2016  Elsevier Interactive Patient Education  2019 [employer]  DASH Eating Plan  DASH stands for "Dietary Approaches to Stop Hypertension." The DASH eating plan is a healthy eating plan that has been shown to reduce high blood pressure (hypertension). It may also reduce your risk for type 2 diabetes, heart disease, and  stroke. The DASH eating plan may also help with weight loss.  What are tips for following this plan?    General guidelines   Avoid eating more than 2,300 mg (milligrams) of salt (sodium) a day. If you have hypertension, you may need to reduce your sodium intake to 1,500 mg a day.   Limit alcohol intake to no more than 1 drink a day for nonpregnant women and 2 drinks a day for men. One drink equals 12 oz of beer, 5 oz of wine, or 1 oz of hard liquor.   Work with your health care provider to maintain a healthy body weight or to lose weight. Ask what an ideal weight is for you.   Get at least 30 minutes of exercise that causes your heart to beat faster (aerobic exercise) most days of the week. Activities may include walking, swimming, or biking.   Work with your health care provider or diet and nutrition specialist (dietitian) to adjust your eating plan to your individual calorie needs.  Reading food labels     Check food labels for the amount of sodium per serving. Choose foods with less than 5 percent of the Daily Value of sodium. Generally, foods with less than 300 mg of sodium per serving fit into this eating plan.   To find whole grains, look for the word "whole" as the first word in the ingredient list.  Shopping   Buy products labeled as "low-sodium" or "no salt added."   Buy fresh foods. Avoid canned foods and premade or frozen meals.  Cooking   Avoid adding salt when cooking. Use salt-free seasonings or herbs instead of table salt or sea salt. Check with your health care provider or pharmacist before using salt substitutes.   Do not fry foods. Cook foods using healthy methods such as baking, boiling, grilling, and broiling instead.   Cook with heart-healthy oils, such as olive, canola, soybean, or sunflower oil.  Meal planning   Eat a balanced diet that includes:  ? 5 or more servings of fruits and vegetables each day. At each meal, try to fill half of your plate with fruits and vegetables.  ? Up  to 6-8 servings of whole grains each day.  ? Less than 6 oz of lean meat, poultry, or fish each day. A 3-oz serving of meat is about the same size as a deck of cards. One egg equals 1 oz.  ? 2 servings of low-fat dairy each day.  ? A serving of nuts, seeds, or beans 5 times each week.  ? Heart-healthy fats. Healthy fats called Omega-3 fatty acids are found in foods such as flaxseeds and coldwater fish, like sardines, salmon, and mackerel.   Limit how much you eat of the following:  ?   Canned or prepackaged foods.  ? Food that is high in trans fat, such as fried foods.  ? Food that is high in saturated fat, such as fatty meat.  ? Sweets, desserts, sugary drinks, and other foods with added sugar.  ? Full-fat dairy products.   Do not salt foods before eating.   Try to eat at least 2 vegetarian meals each week.   Eat more home-cooked food and less restaurant, buffet, and fast food.   When eating at a restaurant, ask that your food be prepared with less salt or no salt, if possible.  What foods are recommended?  The items listed may not be a complete list. Talk with your dietitian about what dietary choices are best for you.  Grains  Whole-grain or whole-wheat bread. Whole-grain or whole-wheat pasta. Brown rice. Oatmeal. Quinoa. Bulgur. Whole-grain and low-sodium cereals. Pita bread. Low-fat, low-sodium crackers. Whole-wheat flour tortillas.  Vegetables  Fresh or frozen vegetables (raw, steamed, roasted, or grilled). Low-sodium or reduced-sodium tomato and vegetable juice. Low-sodium or reduced-sodium tomato sauce and tomato paste. Low-sodium or reduced-sodium canned vegetables.  Fruits  All fresh, dried, or frozen fruit. Canned fruit in natural juice (without added sugar).  Meat and other protein foods  Skinless chicken or turkey. Ground chicken or turkey. Pork with fat trimmed off. Fish and seafood. Egg whites. Dried beans, peas, or lentils. Unsalted nuts, nut butters, and seeds. Unsalted canned beans. Lean cuts of  beef with fat trimmed off. Low-sodium, lean deli meat.  Dairy  Low-fat (1%) or fat-free (skim) milk. Fat-free, low-fat, or reduced-fat cheeses. Nonfat, low-sodium ricotta or cottage cheese. Low-fat or nonfat yogurt. Low-fat, low-sodium cheese.  Fats and oils  Soft margarine without trans fats. Vegetable oil. Low-fat, reduced-fat, or light mayonnaise and salad dressings (reduced-sodium). Canola, safflower, olive, soybean, and sunflower oils. Avocado.  Seasoning and other foods  Herbs. Spices. Seasoning mixes without salt. Unsalted popcorn and pretzels. Fat-free sweets.  What foods are not recommended?  The items listed may not be a complete list. Talk with your dietitian about what dietary choices are best for you.  Grains  Baked goods made with fat, such as croissants, muffins, or some breads. Dry pasta or rice meal packs.  Vegetables  Creamed or fried vegetables. Vegetables in a cheese sauce. Regular canned vegetables (not low-sodium or reduced-sodium). Regular canned tomato sauce and paste (not low-sodium or reduced-sodium). Regular tomato and vegetable juice (not low-sodium or reduced-sodium). Pickles. Olives.  Fruits  Canned fruit in a light or heavy syrup. Fried fruit. Fruit in cream or butter sauce.  Meat and other protein foods  Fatty cuts of meat. Ribs. Fried meat. Bacon. Sausage. Bologna and other processed lunch meats. Salami. Fatback. Hotdogs. Bratwurst. Salted nuts and seeds. Canned beans with added salt. Canned or smoked fish. Whole eggs or egg yolks. Chicken or turkey with skin.  Dairy  Whole or 2% milk, cream, and half-and-half. Whole or full-fat cream cheese. Whole-fat or sweetened yogurt. Full-fat cheese. Nondairy creamers. Whipped toppings. Processed cheese and cheese spreads.  Fats and oils  Butter. Stick margarine. Lard. Shortening. Ghee. Bacon fat. Tropical oils, such as coconut, palm kernel, or palm oil.  Seasoning and other foods  Salted popcorn and pretzels. Onion salt, garlic salt, seasoned  salt, table salt, and sea salt. Worcestershire sauce. Tartar sauce. Barbecue sauce. Teriyaki sauce. Soy sauce, including reduced-sodium. Steak sauce. Canned and packaged gravies. Fish sauce. Oyster sauce. Cocktail sauce. Horseradish that you find on the shelf. Ketchup. Mustard. Meat flavorings and tenderizers.   Bouillon cubes. Hot sauce and Tabasco sauce. Premade or packaged marinades. Premade or packaged taco seasonings. Relishes. Regular salad dressings.  Where to find more information:   National Heart, Lung, and Blood Institute: www.nhlbi.nih.gov   American Heart Association: www.heart.org  Summary   The DASH eating plan is a healthy eating plan that has been shown to reduce high blood pressure (hypertension). It may also reduce your risk for type 2 diabetes, heart disease, and stroke.   With the DASH eating plan, you should limit salt (sodium) intake to 2,300 mg a day. If you have hypertension, you may need to reduce your sodium intake to 1,500 mg a day.   When on the DASH eating plan, aim to eat more fresh fruits and vegetables, whole grains, lean proteins, low-fat dairy, and heart-healthy fats.   Work with your health care provider or diet and nutrition specialist (dietitian) to adjust your eating plan to your individual calorie needs.  This information is not intended to replace advice given to you by your health care provider. Make sure you discuss any questions you have with your health care provider.  Document Released: 04/28/2011 Document Revised: 05/02/2016 Document Reviewed: 05/02/2016  Elsevier Interactive Patient Education  2019 [employer]
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
## 2018-11-15 ENCOUNTER — Telehealth: Payer: Self-pay | Admitting: Family Medicine

## 2018-11-15 ENCOUNTER — Ambulatory Visit: Payer: Self-pay | Attending: Family Medicine

## 2018-11-15 ENCOUNTER — Other Ambulatory Visit: Payer: Self-pay

## 2018-11-15 NOTE — Telephone Encounter (Signed)
LVM to Pt since he has an TELE visit with financial, at 4:30pm VM will try again

## 2018-12-19 ENCOUNTER — Ambulatory Visit: Payer: Self-pay | Admitting: Family Medicine

## 2018-12-26 ENCOUNTER — Ambulatory Visit: Payer: Self-pay | Attending: Family Medicine | Admitting: Family Medicine

## 2018-12-26 ENCOUNTER — Encounter: Payer: Self-pay | Admitting: Family Medicine

## 2018-12-26 ENCOUNTER — Other Ambulatory Visit: Payer: Self-pay

## 2018-12-26 VITALS — BP 112/78 | HR 53 | Temp 98.3°F | Ht 67.0 in | Wt 228.2 lb

## 2018-12-26 DIAGNOSIS — K0889 Other specified disorders of teeth and supporting structures: Secondary | ICD-10-CM
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
## 2018-12-26 NOTE — Progress Notes (Signed)
Blood pressure follow up

## 2019-03-02 ENCOUNTER — Emergency Department (HOSPITAL_COMMUNITY): Payer: No Typology Code available for payment source

## 2019-03-02 ENCOUNTER — Emergency Department (HOSPITAL_COMMUNITY)
Admission: EM | Admit: 2019-03-02 | Discharge: 2019-03-03 | Disposition: A | Discharge status: Home / Self Care | Payer: No Typology Code available for payment source | Attending: Emergency Medicine | Admitting: Emergency Medicine

## 2019-03-02 ENCOUNTER — Encounter (HOSPITAL_COMMUNITY): Payer: Self-pay

## 2019-03-02 DIAGNOSIS — Y939 Activity, unspecified: Secondary | ICD-10-CM | POA: Diagnosis not present

## 2019-03-02 DIAGNOSIS — Y999 Unspecified external cause status: Secondary | ICD-10-CM | POA: Insufficient documentation

## 2019-03-02 DIAGNOSIS — Y929 Unspecified place or not applicable: Secondary | ICD-10-CM | POA: Diagnosis not present

## 2019-03-02 DIAGNOSIS — M79621 Pain in right upper arm: Secondary | ICD-10-CM | POA: Diagnosis not present

## 2019-03-02 DIAGNOSIS — G44319 Acute post-traumatic headache, not intractable: Secondary | ICD-10-CM

## 2019-03-02 DIAGNOSIS — F17210 Nicotine dependence, cigarettes, uncomplicated: Secondary | ICD-10-CM | POA: Insufficient documentation

## 2019-03-02 DIAGNOSIS — S0990XA Unspecified injury of head, initial encounter: Secondary | ICD-10-CM | POA: Diagnosis present

## 2019-03-02 DIAGNOSIS — R0789 Other chest pain: Secondary | ICD-10-CM | POA: Insufficient documentation

## 2019-03-02 DIAGNOSIS — R1013 Epigastric pain: Secondary | ICD-10-CM | POA: Diagnosis not present

## 2019-03-02 LAB — CBC WITH DIFFERENTIAL/PLATELET
Abs Immature Granulocytes: 0.07 10*3/uL (ref 0.00–0.07)
Basophils Absolute: 0.1 10*3/uL (ref 0.0–0.1)
Basophils Relative: 0 %
Eosinophils Absolute: 0.5 10*3/uL (ref 0.0–0.5)
Eosinophils Relative: 3 %
HCT: 47.6 % (ref 39.0–52.0)
Hemoglobin: 16.2 g/dL (ref 13.0–17.0)
Immature Granulocytes: 1 %
Lymphocytes Relative: 31 %
Lymphs Abs: 4.2 10*3/uL — ABNORMAL HIGH (ref 0.7–4.0)
MCH: 30 pg (ref 26.0–34.0)
MCHC: 34 g/dL (ref 30.0–36.0)
MCV: 88.1 fL (ref 80.0–100.0)
Monocytes Absolute: 0.7 10*3/uL (ref 0.1–1.0)
Monocytes Relative: 5 %
Neutro Abs: 8.1 10*3/uL — ABNORMAL HIGH (ref 1.7–7.7)
Neutrophils Relative %: 60 %
Platelets: 333 10*3/uL (ref 150–400)
RBC: 5.4 MIL/uL (ref 4.22–5.81)
RDW: 12.4 % (ref 11.5–15.5)
WBC: 13.5 10*3/uL — ABNORMAL HIGH (ref 4.0–10.5)
nRBC: 0 % (ref 0.0–0.2)

## 2019-03-02 LAB — I-STAT CHEM 8, ED
BUN: 17 mg/dL (ref 6–20)
Calcium, Ion: 1.28 mmol/L (ref 1.15–1.40)
Chloride: 105 mmol/L (ref 98–111)
Creatinine, Ser: 0.8 mg/dL (ref 0.61–1.24)
Glucose, Bld: 125 mg/dL — ABNORMAL HIGH (ref 70–99)
HCT: 47 % (ref 39.0–52.0)
Hemoglobin: 16 g/dL (ref 13.0–17.0)
Potassium: 3 mmol/L — ABNORMAL LOW (ref 3.5–5.1)
Sodium: 142 mmol/L (ref 135–145)
TCO2: 24 mmol/L (ref 22–32)

## 2019-03-02 LAB — PROTIME-INR
INR: 1 (ref 0.8–1.2)
Prothrombin Time: 13.2 seconds (ref 11.4–15.2)

## 2019-03-02 LAB — COMPREHENSIVE METABOLIC PANEL
ALT: 77 U/L — ABNORMAL HIGH (ref 0–44)
AST: 44 U/L — ABNORMAL HIGH (ref 15–41)
Albumin: 4.3 g/dL (ref 3.5–5.0)
Alkaline Phosphatase: 72 U/L (ref 38–126)
Anion gap: 11 (ref 5–15)
BUN: 15 mg/dL (ref 6–20)
CO2: 23 mmol/L (ref 22–32)
Calcium: 10 mg/dL (ref 8.9–10.3)
Chloride: 106 mmol/L (ref 98–111)
Creatinine, Ser: 0.86 mg/dL (ref 0.61–1.24)
GFR calc Af Amer: >60 mL/min (ref >60)
GFR calc non Af Amer: >60 mL/min (ref >60)
Glucose, Bld: 126 mg/dL — ABNORMAL HIGH (ref 70–99)
Potassium: 3.2 mmol/L — ABNORMAL LOW (ref 3.5–5.1)
Sodium: 140 mmol/L (ref 135–145)
Total Bilirubin: 0.6 mg/dL (ref 0.3–1.2)
Total Protein: 7.5 g/dL (ref 6.5–8.1)

## 2019-03-02 LAB — ETHANOL: Alcohol, Ethyl (B): <10 mg/dL (ref <10)

## 2019-03-02 LAB — LACTIC ACID, PLASMA: Lactic Acid, Venous: 1.7 mmol/L (ref 0.5–1.9)

## 2019-03-02 MED ORDER — ACETAMINOPHEN 500 MG PO TABS
1000.0000 mg | ORAL_TABLET | Freq: Once | ORAL | Status: AC
  Administered 2019-03-02: 1000 mg via ORAL
  Filled 2019-03-02: qty 2

## 2019-03-02 MED ORDER — IOHEXOL 300 MG/ML  SOLN
100.0000 mL | Freq: Once | INTRAMUSCULAR | Status: AC | PRN
  Administered 2019-03-02: 100 mL via INTRAVENOUS

## 2019-03-02 MED ORDER — GADOBUTROL 1 MMOL/ML IV SOLN
10.0000 mL | Freq: Once | INTRAVENOUS | Status: AC | PRN
  Administered 2019-03-02: 10 mL via INTRAVENOUS

## 2019-03-02 MED ORDER — ONDANSETRON HCL 4 MG/2ML IJ SOLN
4.0000 mg | Freq: Once | INTRAMUSCULAR | Status: AC
  Administered 2019-03-02: 19:00:00 4 mg via INTRAVENOUS
  Filled 2019-03-02: qty 2

## 2019-03-02 MED ORDER — FENTANYL CITRATE (PF) 100 MCG/2ML IJ SOLN
100.0000 ug | Freq: Once | INTRAMUSCULAR | Status: AC
  Administered 2019-03-02: 100 ug via INTRAVENOUS
  Filled 2019-03-02: qty 2

## 2019-03-02 MED ORDER — IOHEXOL 350 MG/ML SOLN: 100.0000 mL | Freq: Once | INTRAVENOUS | Status: DC | PRN

## 2019-03-02 NOTE — ED Notes (Signed)
Patient transported to CT 
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
## 2019-03-02 NOTE — Discharge Instructions (Addendum)
As we discussed, MRI today did show a small area of bleeding, but this appears stable.  You have been monitored in the ER for almost 9 hours without acute changes so felt to be stable for discharge.  You may continue to have a headache for a few days which is normal with this type of injury. See attached information for more details. Take the prescribed medication as directed.  Do not drive while taking pain medication.   Follow-up with your primary care doctor. Return to the ED for new or worsening symptoms.
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
## 2019-03-03 MED ORDER — ONDANSETRON 4 MG PO TBDP: 4.0000 mg | ORAL_TABLET | Freq: Three times a day (TID) | ORAL | 0 refills | Status: DC | PRN

## 2019-03-03 MED ORDER — OXYCODONE-ACETAMINOPHEN 5-325 MG PO TABS: 1.0000 | ORAL_TABLET | ORAL | 0 refills | Status: DC | PRN

## 2019-03-03 NOTE — ED Notes (Signed)
Unable to obtain e-sign d/t equipment shortage.  D/c pt and s/o d/c instructions, medications, follow up and return precautions.  Both verbalized understanding of the above.
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
## 2019-03-05 ENCOUNTER — Telehealth: Payer: Self-pay | Admitting: Family Medicine

## 2019-03-05 ENCOUNTER — Other Ambulatory Visit: Payer: Self-pay | Admitting: Family Medicine

## 2019-03-05 DIAGNOSIS — S06340S 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, sequela: Secondary | ICD-10-CM

## 2019-03-05 NOTE — Telephone Encounter (Addendum)
Please notify patient/wife that urgent referral was placed to neurology.  Also there is a patient,  who is on my schedule twice this week so perhaps patient can be seen in one of her spots if he needs office visit here as well
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
## 2019-03-06 ENCOUNTER — Encounter (HOSPITAL_COMMUNITY): Payer: Self-pay | Admitting: Emergency Medicine

## 2019-03-06 ENCOUNTER — Telehealth: Payer: Self-pay | Admitting: Family Medicine

## 2019-03-06 ENCOUNTER — Other Ambulatory Visit: Payer: Self-pay

## 2019-03-06 ENCOUNTER — Emergency Department (HOSPITAL_COMMUNITY): Payer: No Typology Code available for payment source

## 2019-03-06 ENCOUNTER — Emergency Department (HOSPITAL_COMMUNITY)
Admission: EM | Admit: 2019-03-06 | Discharge: 2019-03-06 | Disposition: A | Discharge status: Home / Self Care | Payer: No Typology Code available for payment source | Attending: Emergency Medicine | Admitting: Emergency Medicine

## 2019-03-06 DIAGNOSIS — H53149 Visual discomfort, unspecified: Secondary | ICD-10-CM | POA: Diagnosis not present

## 2019-03-06 DIAGNOSIS — M25521 Pain in right elbow: Secondary | ICD-10-CM | POA: Insufficient documentation

## 2019-03-06 DIAGNOSIS — R519 Headache, unspecified: Secondary | ICD-10-CM | POA: Diagnosis present

## 2019-03-06 DIAGNOSIS — F17210 Nicotine dependence, cigarettes, uncomplicated: Secondary | ICD-10-CM | POA: Insufficient documentation

## 2019-03-06 DIAGNOSIS — G44319 Acute post-traumatic headache, not intractable: Secondary | ICD-10-CM | POA: Diagnosis not present

## 2019-03-06 DIAGNOSIS — R0789 Other chest pain: Secondary | ICD-10-CM | POA: Diagnosis not present

## 2019-03-06 DIAGNOSIS — R42 Dizziness and giddiness: Secondary | ICD-10-CM | POA: Diagnosis not present
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
## 2019-03-06 NOTE — Telephone Encounter (Signed)
Patient verified DOB Patients wife is aware of referral being placed to [facility] and provided the phone number for patient to be scheduled as soon as possible. Advised to return to the ER if fever, syncope is noted.
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
## 2019-03-06 NOTE — ED Notes (Signed)
Patient transported to X-ray 
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
## 2019-03-06 NOTE — Discharge Instructions (Addendum)
Stop Oxycodone and do not take Ibuprofen Take extra strength Tylenol as needed for headache  Please follow up with neurology
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
## 2019-03-14 ENCOUNTER — Other Ambulatory Visit: Payer: Self-pay

## 2019-03-14 ENCOUNTER — Encounter: Payer: Self-pay | Admitting: Neurology

## 2019-03-14 ENCOUNTER — Ambulatory Visit (INDEPENDENT_AMBULATORY_CARE_PROVIDER_SITE_OTHER): Payer: Self-pay | Admitting: Neurology

## 2019-03-14 VITALS — BP 120/72 | HR 77 | Ht 67.0 in | Wt 224.6 lb

## 2019-03-14 DIAGNOSIS — S06341A Traumatic hemorrhage of right cerebrum with loss of consciousness of 30 minutes or less, initial encounter: Secondary | ICD-10-CM

## 2019-03-14 DIAGNOSIS — S060X1A Concussion with loss of consciousness of 30 minutes or less, initial encounter: Secondary | ICD-10-CM

## 2019-03-14 NOTE — Patient Instructions (Signed)
From my standpoint, you do not have any residual symptoms of concussion.   Follow up as needed.

## 2019-03-26 ENCOUNTER — Encounter: Payer: Self-pay | Admitting: Critical Care Medicine

## 2019-03-26 ENCOUNTER — Other Ambulatory Visit: Payer: Self-pay

## 2019-03-26 ENCOUNTER — Ambulatory Visit: Payer: Self-pay | Attending: Critical Care Medicine | Admitting: Critical Care Medicine

## 2019-03-26 VITALS — BP 138/88 | HR 65 | Temp 98.3°F | Ht 67.0 in | Wt 230.8 lb

## 2019-03-26 DIAGNOSIS — S06340D 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, subsequent encounter: Secondary | ICD-10-CM

## 2019-03-26 DIAGNOSIS — S06340S 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, sequela: Secondary | ICD-10-CM

## 2019-03-26 DIAGNOSIS — S06340A 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, initial encounter: Secondary | ICD-10-CM | POA: Insufficient documentation
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
## 2019-03-26 NOTE — Patient Instructions (Signed)
Stay off work until November 16 and you may return to full duties see letter that I  Prepared  Primary care visit in 6 months with Dr. Chapman Fitch

## 2020-08-13 ENCOUNTER — Ambulatory Visit: Payer: Self-pay | Attending: Critical Care Medicine | Admitting: Critical Care Medicine

## 2020-08-13 ENCOUNTER — Other Ambulatory Visit: Payer: Self-pay

## 2020-08-13 ENCOUNTER — Encounter: Payer: Self-pay | Admitting: Critical Care Medicine

## 2020-08-13 DIAGNOSIS — Z5329 Procedure and treatment not carried out because of patient's decision for other reasons: Secondary | ICD-10-CM

## 2020-08-13 DIAGNOSIS — Z91199 Patient's noncompliance with other medical treatment and regimen due to unspecified reason: Secondary | ICD-10-CM
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
## 2020-08-13 NOTE — Progress Notes (Signed)
Patient ID: Kevin Fox, male   DOB: April 24, 1996, 25 y.o.   MRN: [id number] Pt was a NO SHOW

## 2020-12-08 ENCOUNTER — Ambulatory Visit: Payer: Self-pay | Admitting: Physician Assistant

## 2020-12-08 ENCOUNTER — Other Ambulatory Visit (HOSPITAL_COMMUNITY)
Admission: RE | Admit: 2020-12-08 | Discharge: 2020-12-08 | Disposition: A | Discharge status: Home / Self Care | Payer: Self-pay | Source: Ambulatory Visit | Attending: Physician Assistant | Admitting: Physician Assistant

## 2020-12-08 ENCOUNTER — Other Ambulatory Visit: Payer: Self-pay

## 2020-12-08 VITALS — BP 123/62 | HR 82 | Temp 98.2°F | Resp 18 | Ht 67.0 in | Wt 225.0 lb

## 2020-12-08 DIAGNOSIS — R300 Dysuria: Secondary | ICD-10-CM | POA: Insufficient documentation

## 2020-12-08 DIAGNOSIS — N201 Calculus of ureter: Secondary | ICD-10-CM

## 2020-12-08 DIAGNOSIS — R310 Gross hematuria: Secondary | ICD-10-CM

## 2020-12-08 LAB — POCT URINALYSIS DIP (CLINITEK)
Bilirubin, UA: NEGATIVE
Glucose, UA: NEGATIVE mg/dL
Leukocytes, UA: NEGATIVE
Nitrite, UA: NEGATIVE
POC PROTEIN,UA: NEGATIVE
Spec Grav, UA: 1.025 (ref 1.010–1.025)
Urobilinogen, UA: 1 E.U./dL
pH, UA: 7 (ref 5.0–8.0)

## 2020-12-08 MED ORDER — TAMSULOSIN HCL 0.4 MG PO CAPS
0.4000 mg | ORAL_CAPSULE | Freq: Every day | ORAL | 0 refills | Status: AC
  Filled 2020-12-08: qty 14, 14d supply, fill #0

## 2020-12-08 MED ORDER — IBUPROFEN 800 MG PO TABS
800.0000 mg | ORAL_TABLET | Freq: Three times a day (TID) | ORAL | 0 refills | Status: AC | PRN
  Filled 2020-12-08: qty 30, 10d supply, fill #0

## 2020-12-08 NOTE — Progress Notes (Signed)
Patient reports back pain 1 week ago with spurts of urine while releasing. Patient states he has had kidney stones in the past and sensation is similar. Patient has eaten today. Patient reports abdominal pain being present today and scaled at a 5.

## 2020-12-08 NOTE — Patient Instructions (Signed)
You are going to take Flomax once a day for the next 14 days or until your symptoms resolve.  For the pain, you will use ibuprofen 800 mg every 8 hours as needed.  I hope that you feel better soon, we will call you with today's lab results  Roney Jaffe, PA-C Physician Assistant [facility] [email or web address]   Kidney Stones  Kidney stones are solid, rock-like deposits that form inside of the kidneys. The kidneys are a pair of organs that make urine. A kidney stone may form in a kidney and move into other parts of the urinary tract, including the tubes that connect the kidneys to the bladder (ureters), the bladder, and the tube that carries urine out of the body (urethra). As the stone moves through these areas, it can cause intense pain and blockthe flow of urine. Kidney stones are created when high levels of certain minerals are found in the urine. The stones are usually passed out of the body through urination, but insome cases, medical treatment may be needed to remove them. What are the causes? Kidney stones may be caused by: A condition in which certain glands produce too much parathyroid hormone (primary hyperparathyroidism), which causes too much calcium buildup in the blood. A buildup of uric acid crystals in the bladder (hyperuricosuria). Uric acid is a chemical that the body produces when you eat certain foods. It usually exits the body in the urine. Narrowing (stricture) of one or both of the ureters. A kidney blockage that is present at birth (congenital obstruction). Past surgery on the kidney or the ureters, such as gastric bypass surgery. What increases the risk? The following factors may make you more likely to develop this condition: Having had a kidney stone in the past. Having a family history of kidney stones. Not drinking enough water. Eating a diet that is high in protein, salt (sodium), or sugar. Being  overweight or obese. What are the signs or symptoms? Symptoms of a kidney stone may include: Pain in the side of the abdomen, right below the ribs (flank pain). Pain usually spreads (radiates) to the groin. Needing to urinate frequently or urgently. Painful urination. Blood in the urine (hematuria). Nausea. Vomiting. Fever and chills. How is this diagnosed? This condition may be diagnosed based on: Your symptoms and medical history. A physical exam. Blood tests. Urine tests. These may be done before and after the stone passes out of your body through urination. Imaging tests, such as a CT scan, abdominal X-ray, or ultrasound. A procedure to examine the inside of the bladder (cystoscopy). How is this treated? Treatment for kidney stones depends on the size, location, and makeup of the stones. Kidney stones will often pass out of the body through urination. You may need to: Increase your fluid intake to help pass the stone. In some cases, you may be given fluids through an IV and may need to be monitored at the hospital. Take medicine for pain. Make changes in your diet to help prevent kidney stones from coming back. Sometimes, medical procedures are needed to remove a kidney stone. This may involve: A procedure to break up kidney stones using: A focused beam of light (laser therapy). Shock waves (extracorporeal shock wave lithotripsy). Surgery to remove kidney stones. This may be needed if you have severe pain or have stones that block your urinary tract. Follow these instructions at home: Medicines Take over-the-counter and prescription medicines only as told by your health care provider. Ask your health  care provider if the medicine prescribed to you requires you to avoid driving or using heavy machinery. Eating and drinking Drink enough fluid to keep your urine pale yellow. You may be instructed to drink at least 8-10 glasses of water each day. This will help you pass the kidney  stone. If directed, change your diet. This may include: Limiting how much sodium you eat. Eating more fruits and vegetables. Limiting how much animal protein--such as red meat, poultry, fish, and eggs--you eat. Follow instructions from your health care provider about eating or drinking restrictions. General instructions Collect urine samples as told by your health care provider. You may need to collect a urine sample: 24 hours after you pass the stone. 8-12 weeks after passing the kidney stone, and every 6-12 months after that. Strain your urine every time you urinate, for as long as directed. Use the strainer that your health care provider recommends. Do not throw out the kidney stone after passing it. Keep the stone so it can be tested by your health care provider. Testing the makeup of your kidney stone may help prevent you from getting kidney stones in the future. Keep all follow-up visits as told by your health care provider. This is important. You may need follow-up X-rays or ultrasounds to make sure that your stone has passed. How is this prevented? To prevent another kidney stone: Drink enough fluid to keep your urine pale yellow. This is the best way to prevent kidney stones. Eat a healthy diet and follow recommendations from your health care provider about foods to avoid. You may be instructed to eat a low-protein diet. Recommendations vary depending on the type of kidney stone that you have. Maintain a healthy weight. Where to find more information National Kidney Foundation (NKF): www.kidney.org [facility]): www.urologyhealth.org Contact a health care provider if: You have pain that gets worse or does not get better with medicine. Get help right away if: You have a fever or chills. You develop severe pain. You develop new abdominal pain. You faint. You are unable to urinate. Summary Kidney stones are solid, rock-like deposits that form inside of the  kidneys. Kidney stones can cause nausea, vomiting, blood in the urine, abdominal pain, and the urge to urinate frequently. Treatment for kidney stones depends on the size, location, and makeup of the stones. Kidney stones will often pass out of the body through urination. Kidney stones can be prevented by drinking enough fluids, eating a healthy diet, and maintaining a healthy weight. This information is not intended to replace advice given to you by your health care provider. Make sure you discuss any questions you have with your healthcare provider. Document Revised: 09/21/2018 Document Reviewed: 09/25/2018 Elsevier Patient Education  2022 [employer].
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
## 2020-12-09 DIAGNOSIS — N201 Calculus of ureter: Secondary | ICD-10-CM | POA: Insufficient documentation

## 2020-12-09 DIAGNOSIS — R310 Gross hematuria: Secondary | ICD-10-CM | POA: Insufficient documentation

## 2020-12-10 LAB — URINE CYTOLOGY ANCILLARY ONLY
Chlamydia: NEGATIVE
Comment: NEGATIVE
Comment: NEGATIVE
Comment: NORMAL
Neisseria Gonorrhea: NEGATIVE
Trichomonas: NEGATIVE

## 2020-12-16 ENCOUNTER — Telehealth: Payer: Self-pay | Admitting: *Deleted

## 2020-12-16 ENCOUNTER — Ambulatory Visit: Payer: Self-pay | Attending: Critical Care Medicine

## 2020-12-16 ENCOUNTER — Other Ambulatory Visit: Payer: Self-pay

## 2020-12-16 NOTE — Telephone Encounter (Signed)
Medical Assistant used [employer] to contact patient.  Interpreter Name: Trixie Deis #: [id number] Patient is aware of urine being negative for all STI concerns.

## 2020-12-16 NOTE — Telephone Encounter (Signed)
-----   Message from Roney Jaffe, [place] sent at 12/10/2020  6:20 PM EDT ----- Please call patient and let him know that his urine cytology was negative for gonorrhea, chlamydia, and trichomonas.

## 2020-12-30 ENCOUNTER — Telehealth: Payer: Self-pay | Admitting: Critical Care Medicine

## 2020-12-30 NOTE — Telephone Encounter (Signed)
Pt was sent a letter from financial dept. Inform them, that the application they submitted was incomplete, since they were missing some documentation at the time of the appointment, Pt need to reschedule and resubmit all new papers and application for [employer] and [facility], P.S. old documents has been sent back by mail to the Pt and Pt. need to make a new appt. 
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
